# Patient Record
Sex: Female | Born: 1945
Health system: Southern US, Community
[De-identification: ages and names within clinical notes are randomized; demographics above are authoritative.]

## PROBLEM LIST (undated history)

## (undated) DIAGNOSIS — C801 Malignant (primary) neoplasm, unspecified: Secondary | ICD-10-CM

## (undated) DIAGNOSIS — K859 Acute pancreatitis without necrosis or infection, unspecified: Secondary | ICD-10-CM

## (undated) DIAGNOSIS — F329 Major depressive disorder, single episode, unspecified: Secondary | ICD-10-CM

## (undated) DIAGNOSIS — Z853 Personal history of malignant neoplasm of breast: Secondary | ICD-10-CM

## (undated) DIAGNOSIS — E785 Hyperlipidemia, unspecified: Secondary | ICD-10-CM

## (undated) DIAGNOSIS — F419 Anxiety disorder, unspecified: Secondary | ICD-10-CM

## (undated) DIAGNOSIS — F32A Depression, unspecified: Secondary | ICD-10-CM

## (undated) DIAGNOSIS — D649 Anemia, unspecified: Secondary | ICD-10-CM

## (undated) DIAGNOSIS — M1990 Unspecified osteoarthritis, unspecified site: Secondary | ICD-10-CM

## (undated) DIAGNOSIS — I10 Essential (primary) hypertension: Secondary | ICD-10-CM

## (undated) DIAGNOSIS — K56609 Unspecified intestinal obstruction, unspecified as to partial versus complete obstruction: Secondary | ICD-10-CM

## (undated) HISTORY — DX: Unspecified intestinal obstruction, unspecified as to partial versus complete obstruction: K56.609

## (undated) HISTORY — DX: Personal history of malignant neoplasm of breast: Z85.3

## (undated) HISTORY — DX: Acute pancreatitis without necrosis or infection, unspecified: K85.90

## (undated) HISTORY — DX: Hyperlipidemia, unspecified: E78.5

## (undated) HISTORY — PX: ABDOMINAL HYSTERECTOMY: SHX81

## (undated) HISTORY — DX: Anxiety disorder, unspecified: F41.9

---

## 1998-12-20 ENCOUNTER — Ambulatory Visit (HOSPITAL_COMMUNITY): Admission: RE | Admit: 1998-12-20 | Discharge: 1998-12-20 | Payer: Self-pay | Admitting: Family Medicine

## 1998-12-20 ENCOUNTER — Encounter (INDEPENDENT_AMBULATORY_CARE_PROVIDER_SITE_OTHER): Payer: Self-pay

## 1998-12-20 ENCOUNTER — Encounter: Payer: Self-pay | Admitting: Family Medicine

## 2000-02-25 ENCOUNTER — Ambulatory Visit (HOSPITAL_COMMUNITY): Admission: RE | Admit: 2000-02-25 | Discharge: 2000-02-25 | Payer: Self-pay | Admitting: Family Medicine

## 2000-02-25 ENCOUNTER — Encounter: Payer: Self-pay | Admitting: Family Medicine

## 2000-03-17 ENCOUNTER — Encounter: Admission: RE | Admit: 2000-03-17 | Discharge: 2000-03-17 | Payer: Self-pay | Admitting: Family Medicine

## 2001-07-27 ENCOUNTER — Encounter: Payer: Self-pay | Admitting: Family Medicine

## 2001-07-27 ENCOUNTER — Ambulatory Visit (HOSPITAL_COMMUNITY): Admission: RE | Admit: 2001-07-27 | Discharge: 2001-07-27 | Payer: Self-pay | Admitting: Family Medicine

## 2001-08-12 ENCOUNTER — Encounter: Admission: RE | Admit: 2001-08-12 | Discharge: 2001-08-12 | Payer: Self-pay | Admitting: Family Medicine

## 2001-08-12 ENCOUNTER — Encounter (INDEPENDENT_AMBULATORY_CARE_PROVIDER_SITE_OTHER): Payer: Self-pay | Admitting: *Deleted

## 2001-08-12 ENCOUNTER — Encounter: Payer: Self-pay | Admitting: Family Medicine

## 2001-08-12 DIAGNOSIS — Z853 Personal history of malignant neoplasm of breast: Secondary | ICD-10-CM

## 2001-08-17 DIAGNOSIS — Z853 Personal history of malignant neoplasm of breast: Secondary | ICD-10-CM

## 2001-08-17 HISTORY — DX: Personal history of malignant neoplasm of breast: Z85.3

## 2001-08-19 ENCOUNTER — Encounter: Admission: RE | Admit: 2001-08-19 | Discharge: 2001-08-19 | Payer: Self-pay | Admitting: Surgery

## 2001-08-19 ENCOUNTER — Encounter: Payer: Self-pay | Admitting: Surgery

## 2001-08-20 ENCOUNTER — Encounter: Admission: RE | Admit: 2001-08-20 | Discharge: 2001-08-20 | Payer: Self-pay | Admitting: Surgery

## 2001-08-20 ENCOUNTER — Ambulatory Visit (HOSPITAL_BASED_OUTPATIENT_CLINIC_OR_DEPARTMENT_OTHER): Admission: RE | Admit: 2001-08-20 | Discharge: 2001-08-20 | Payer: Self-pay | Admitting: Surgery

## 2001-08-20 ENCOUNTER — Encounter: Payer: Self-pay | Admitting: Surgery

## 2001-08-20 ENCOUNTER — Encounter (INDEPENDENT_AMBULATORY_CARE_PROVIDER_SITE_OTHER): Payer: Self-pay | Admitting: *Deleted

## 2001-08-20 HISTORY — PX: BREAST LUMPECTOMY W/ NEEDLE LOCALIZATION: SHX1266

## 2001-08-31 ENCOUNTER — Ambulatory Visit: Admission: RE | Admit: 2001-08-31 | Discharge: 2001-11-29 | Payer: Self-pay | Admitting: Radiation Oncology

## 2001-12-08 ENCOUNTER — Ambulatory Visit (HOSPITAL_COMMUNITY): Admission: RE | Admit: 2001-12-08 | Discharge: 2001-12-08 | Payer: Self-pay | Admitting: Family Medicine

## 2001-12-08 ENCOUNTER — Encounter: Payer: Self-pay | Admitting: Family Medicine

## 2002-01-19 ENCOUNTER — Encounter: Admission: RE | Admit: 2002-01-19 | Discharge: 2002-01-19 | Payer: Self-pay | Admitting: Surgery

## 2002-01-19 ENCOUNTER — Encounter: Payer: Self-pay | Admitting: Surgery

## 2002-03-09 ENCOUNTER — Encounter: Payer: Self-pay | Admitting: Surgery

## 2002-03-09 ENCOUNTER — Encounter: Admission: RE | Admit: 2002-03-09 | Discharge: 2002-03-09 | Payer: Self-pay | Admitting: Surgery

## 2002-06-22 ENCOUNTER — Encounter: Admission: RE | Admit: 2002-06-22 | Discharge: 2002-06-22 | Payer: Self-pay | Admitting: Surgery

## 2002-06-22 ENCOUNTER — Encounter: Payer: Self-pay | Admitting: Surgery

## 2003-07-14 ENCOUNTER — Encounter: Admission: RE | Admit: 2003-07-14 | Discharge: 2003-07-14 | Payer: Self-pay | Admitting: Surgery

## 2004-11-20 ENCOUNTER — Encounter: Admission: RE | Admit: 2004-11-20 | Discharge: 2004-11-20 | Payer: Self-pay | Admitting: Family Medicine

## 2005-01-23 ENCOUNTER — Encounter: Admission: RE | Admit: 2005-01-23 | Discharge: 2005-01-23 | Payer: Self-pay | Admitting: Family Medicine

## 2006-01-01 ENCOUNTER — Encounter: Admission: RE | Admit: 2006-01-01 | Discharge: 2006-01-01 | Payer: Self-pay | Admitting: Family Medicine

## 2007-02-05 ENCOUNTER — Encounter: Admission: RE | Admit: 2007-02-05 | Discharge: 2007-02-05 | Payer: Self-pay | Admitting: Family Medicine

## 2008-02-10 ENCOUNTER — Ambulatory Visit (HOSPITAL_COMMUNITY): Admission: RE | Admit: 2008-02-10 | Discharge: 2008-02-10 | Payer: Self-pay | Admitting: Family Medicine

## 2009-01-06 ENCOUNTER — Observation Stay (HOSPITAL_COMMUNITY): Admission: EM | Admit: 2009-01-06 | Discharge: 2009-01-07 | Payer: Self-pay | Admitting: Emergency Medicine

## 2009-02-02 ENCOUNTER — Encounter: Admission: RE | Admit: 2009-02-02 | Discharge: 2009-02-02 | Payer: Self-pay | Admitting: Gastroenterology

## 2009-02-15 ENCOUNTER — Ambulatory Visit (HOSPITAL_COMMUNITY): Admission: RE | Admit: 2009-02-15 | Discharge: 2009-02-15 | Payer: Self-pay | Admitting: Family Medicine

## 2009-08-11 ENCOUNTER — Inpatient Hospital Stay (HOSPITAL_COMMUNITY): Admission: EM | Admit: 2009-08-11 | Discharge: 2009-08-19 | Payer: Self-pay | Admitting: Emergency Medicine

## 2009-08-13 ENCOUNTER — Encounter (INDEPENDENT_AMBULATORY_CARE_PROVIDER_SITE_OTHER): Payer: Self-pay

## 2009-08-13 HISTORY — PX: EXPLORATORY LAPAROTOMY W/ BOWEL RESECTION: SHX1544

## 2010-07-10 LAB — CBC
HCT: 35.9 % — ABNORMAL LOW (ref 36.0–46.0)
HCT: 41.2 % (ref 36.0–46.0)
Hemoglobin: 10.8 g/dL — ABNORMAL LOW (ref 12.0–15.0)
Hemoglobin: 11.6 g/dL — ABNORMAL LOW (ref 12.0–15.0)
Hemoglobin: 14 g/dL (ref 12.0–15.0)
MCHC: 33.3 g/dL (ref 30.0–36.0)
MCHC: 33.9 g/dL (ref 30.0–36.0)
MCHC: 34 g/dL (ref 30.0–36.0)
MCHC: 34 g/dL (ref 30.0–36.0)
MCV: 96.5 fL (ref 78.0–100.0)
MCV: 97.1 fL (ref 78.0–100.0)
MCV: 97.2 fL (ref 78.0–100.0)
Platelets: 197 10*3/uL (ref 150–400)
Platelets: 276 10*3/uL (ref 150–400)
RBC: 3.34 MIL/uL — ABNORMAL LOW (ref 3.87–5.11)
RBC: 3.52 MIL/uL — ABNORMAL LOW (ref 3.87–5.11)
RBC: 3.69 MIL/uL — ABNORMAL LOW (ref 3.87–5.11)
RBC: 4.27 MIL/uL (ref 3.87–5.11)
RDW: 15 % (ref 11.5–15.5)
RDW: 15.3 % (ref 11.5–15.5)
WBC: 13.8 10*3/uL — ABNORMAL HIGH (ref 4.0–10.5)
WBC: 6.9 10*3/uL (ref 4.0–10.5)
WBC: 7 10*3/uL (ref 4.0–10.5)

## 2010-07-10 LAB — GLUCOSE, CAPILLARY
Glucose-Capillary: 102 mg/dL — ABNORMAL HIGH (ref 70–99)
Glucose-Capillary: 105 mg/dL — ABNORMAL HIGH (ref 70–99)
Glucose-Capillary: 107 mg/dL — ABNORMAL HIGH (ref 70–99)
Glucose-Capillary: 108 mg/dL — ABNORMAL HIGH (ref 70–99)
Glucose-Capillary: 109 mg/dL — ABNORMAL HIGH (ref 70–99)
Glucose-Capillary: 110 mg/dL — ABNORMAL HIGH (ref 70–99)
Glucose-Capillary: 111 mg/dL — ABNORMAL HIGH (ref 70–99)
Glucose-Capillary: 117 mg/dL — ABNORMAL HIGH (ref 70–99)
Glucose-Capillary: 126 mg/dL — ABNORMAL HIGH (ref 70–99)
Glucose-Capillary: 130 mg/dL — ABNORMAL HIGH (ref 70–99)
Glucose-Capillary: 131 mg/dL — ABNORMAL HIGH (ref 70–99)
Glucose-Capillary: 134 mg/dL — ABNORMAL HIGH (ref 70–99)
Glucose-Capillary: 134 mg/dL — ABNORMAL HIGH (ref 70–99)
Glucose-Capillary: 146 mg/dL — ABNORMAL HIGH (ref 70–99)
Glucose-Capillary: 152 mg/dL — ABNORMAL HIGH (ref 70–99)
Glucose-Capillary: 56 mg/dL — ABNORMAL LOW (ref 70–99)
Glucose-Capillary: 74 mg/dL (ref 70–99)
Glucose-Capillary: 84 mg/dL (ref 70–99)
Glucose-Capillary: 86 mg/dL (ref 70–99)
Glucose-Capillary: 93 mg/dL (ref 70–99)
Glucose-Capillary: 94 mg/dL (ref 70–99)
Glucose-Capillary: 95 mg/dL (ref 70–99)
Glucose-Capillary: 95 mg/dL (ref 70–99)
Glucose-Capillary: 99 mg/dL (ref 70–99)

## 2010-07-10 LAB — COMPREHENSIVE METABOLIC PANEL
ALT: 13 U/L (ref 0–35)
AST: 33 U/L (ref 0–37)
Albumin: 5.4 g/dL — ABNORMAL HIGH (ref 3.5–5.2)
Alkaline Phosphatase: 57 U/L (ref 39–117)
BUN: 15 mg/dL (ref 6–23)
CO2: 28 mEq/L (ref 19–32)
Calcium: 11.1 mg/dL — ABNORMAL HIGH (ref 8.4–10.5)
Chloride: 99 mEq/L (ref 96–112)
Creatinine, Ser: 0.96 mg/dL (ref 0.4–1.2)
GFR calc Af Amer: >60 mL/min (ref >60)
GFR calc non Af Amer: 59 mL/min — ABNORMAL LOW (ref >60)
Glucose, Bld: 193 mg/dL — ABNORMAL HIGH (ref 70–99)
Potassium: 3.9 mEq/L (ref 3.5–5.1)
Sodium: 140 mEq/L (ref 135–145)
Total Bilirubin: 0.6 mg/dL (ref 0.3–1.2)
Total Protein: 8.7 g/dL — ABNORMAL HIGH (ref 6.0–8.3)

## 2010-07-10 LAB — BASIC METABOLIC PANEL
BUN: 11 mg/dL (ref 6–23)
CO2: 23 mEq/L (ref 19–32)
CO2: 26 mEq/L (ref 19–32)
CO2: 27 mEq/L (ref 19–32)
Calcium: 8 mg/dL — ABNORMAL LOW (ref 8.4–10.5)
Calcium: 8.2 mg/dL — ABNORMAL LOW (ref 8.4–10.5)
Chloride: 104 mEq/L (ref 96–112)
Chloride: 107 mEq/L (ref 96–112)
Creatinine, Ser: 0.6 mg/dL (ref 0.4–1.2)
Creatinine, Ser: 0.67 mg/dL (ref 0.4–1.2)
GFR calc Af Amer: >60 mL/min (ref >60)
GFR calc Af Amer: >60 mL/min (ref >60)
GFR calc non Af Amer: >60 mL/min (ref >60)
Glucose, Bld: 127 mg/dL — ABNORMAL HIGH (ref 70–99)
Potassium: 4.3 mEq/L (ref 3.5–5.1)
Sodium: 139 mEq/L (ref 135–145)
Sodium: 140 mEq/L (ref 135–145)

## 2010-07-10 LAB — DIFFERENTIAL
Basophils Absolute: 0 10*3/uL (ref 0.0–0.1)
Basophils Relative: 0 % (ref 0–1)
Eosinophils Absolute: 0.1 10*3/uL (ref 0.0–0.7)
Eosinophils Relative: 1 % (ref 0–5)
Lymphocytes Relative: 17 % (ref 12–46)
Lymphs Abs: 2.3 10*3/uL (ref 0.7–4.0)
Monocytes Absolute: 0.2 10*3/uL (ref 0.1–1.0)
Monocytes Relative: 2 % — ABNORMAL LOW (ref 3–12)
Neutro Abs: 11.2 10*3/uL — ABNORMAL HIGH (ref 1.7–7.7)
Neutrophils Relative %: 81 % — ABNORMAL HIGH (ref 43–77)

## 2010-07-10 LAB — URINE MICROSCOPIC-ADD ON

## 2010-07-10 LAB — LIPASE, BLOOD: Lipase: 12 U/L (ref 11–59)

## 2010-07-10 LAB — URINALYSIS, ROUTINE W REFLEX MICROSCOPIC
Nitrite: NEGATIVE
Protein, ur: 30 mg/dL — AB
Specific Gravity, Urine: 1.027 (ref 1.005–1.030)
Urobilinogen, UA: 0.2 mg/dL (ref 0.0–1.0)

## 2010-07-10 LAB — HEMOGLOBIN A1C: Mean Plasma Glucose: 157 mg/dL — ABNORMAL HIGH (ref <117)

## 2010-07-27 LAB — CULTURE, BLOOD (ROUTINE X 2)

## 2010-07-27 LAB — LIPID PANEL
Cholesterol: 120 mg/dL (ref 0–200)
HDL: 44 mg/dL (ref >39)
LDL Cholesterol: 59 mg/dL (ref 0–99)
Total CHOL/HDL Ratio: 2.7 RATIO
VLDL: 17 mg/dL (ref 0–40)

## 2010-07-27 LAB — RETICULOCYTES
RBC.: 3.33 MIL/uL — ABNORMAL LOW (ref 3.87–5.11)
Retic Ct Pct: 0.5 % (ref 0.4–3.1)

## 2010-07-27 LAB — COMPREHENSIVE METABOLIC PANEL
ALT: 11 U/L (ref 0–35)
ALT: 21 U/L (ref 0–35)
AST: 40 U/L — ABNORMAL HIGH (ref 0–37)
Alkaline Phosphatase: 47 U/L (ref 39–117)
BUN: 5 mg/dL — ABNORMAL LOW (ref 6–23)
CO2: 26 mEq/L (ref 19–32)
CO2: 28 mEq/L (ref 19–32)
Calcium: 9.6 mg/dL (ref 8.4–10.5)
Chloride: 108 mEq/L (ref 96–112)
GFR calc Af Amer: >60 mL/min (ref >60)
GFR calc non Af Amer: >60 mL/min (ref >60)
GFR calc non Af Amer: >60 mL/min (ref >60)
Glucose, Bld: 73 mg/dL (ref 70–99)
Potassium: 3.8 mEq/L (ref 3.5–5.1)
Sodium: 138 mEq/L (ref 135–145)
Sodium: 140 mEq/L (ref 135–145)
Total Bilirubin: 0.6 mg/dL (ref 0.3–1.2)
Total Protein: 5.5 g/dL — ABNORMAL LOW (ref 6.0–8.3)
Total Protein: 7.1 g/dL (ref 6.0–8.3)

## 2010-07-27 LAB — CBC
MCHC: 32.9 g/dL (ref 30.0–36.0)
Platelets: 206 10*3/uL (ref 150–400)
RBC: 3.86 MIL/uL — ABNORMAL LOW (ref 3.87–5.11)
WBC: 6.9 10*3/uL (ref 4.0–10.5)

## 2010-07-27 LAB — DIFFERENTIAL
Basophils Absolute: 0.1 10*3/uL (ref 0.0–0.1)
Basophils Relative: 1 % (ref 0–1)
Eosinophils Absolute: 0.1 10*3/uL (ref 0.0–0.7)
Eosinophils Absolute: 0.2 10*3/uL (ref 0.0–0.7)
Eosinophils Relative: 2 % (ref 0–5)
Lymphs Abs: 3.3 10*3/uL (ref 0.7–4.0)
Monocytes Relative: 4 % (ref 3–12)
Monocytes Relative: 5 % (ref 3–12)
Neutro Abs: 3.8 10*3/uL (ref 1.7–7.7)
Neutrophils Relative %: 56 % (ref 43–77)

## 2010-07-27 LAB — CARDIAC PANEL(CRET KIN+CKTOT+MB+TROPI)
CK, MB: 1.7 ng/mL (ref 0.3–4.0)
CK, MB: 1.7 ng/mL (ref 0.3–4.0)
Relative Index: 1.8 (ref 0.0–2.5)
Relative Index: INVALID (ref 0.0–2.5)
Relative Index: INVALID (ref 0.0–2.5)
Total CK: 99 U/L (ref 7–177)
Total CK: 99 U/L (ref 7–177)
Troponin I: 0.01 ng/mL (ref 0.00–0.06)

## 2010-07-27 LAB — VITAMIN B12: Vitamin B-12: 1256 pg/mL — ABNORMAL HIGH (ref 211–911)

## 2010-07-27 LAB — GLUCOSE, CAPILLARY: Glucose-Capillary: 150 mg/dL — ABNORMAL HIGH (ref 70–99)

## 2010-07-27 LAB — CEA: CEA: 2.7 ng/mL (ref 0.0–5.0)

## 2010-07-27 LAB — PHOSPHORUS: Phosphorus: 3.5 mg/dL (ref 2.3–4.6)

## 2010-07-27 LAB — IRON AND TIBC: Iron: 35 ug/dL — ABNORMAL LOW (ref 42–135)

## 2010-09-07 NOTE — Op Note (Signed)
[place]. [facility]  Patient:    Sara Weeks, Sara Weeks Visit Number: [id number] MRN: [id number]          Service Type: DSU Location: [facility] Attending Physician:  Charlton Haws Dictated by:   Currie Paris, M.D. Proc. Date: 08/20/01 Admit Date:  08/20/2001   CC:         Duncan Dull, M.D.  [facility]   Operative Report  ACCOUNT NO. [id number]. CCS [phone].  PREOPERATIVE DIAGNOSIS:  Ductal carcinoma in situ, left breast lower outer quadrant.  POSTOPERATIVE DIAGNOSIS:  Ductal carcinoma in situ, left breast lower outer quadrant.  PROCEDURE:  Needle-guided excision of left breast cancer.  SURGEON:  Currie Paris, M.D.  ANESTHESIA:  General.  CLINICAL HISTORY:  This patient is a 65 year old who recently had a mammogram done which showed some new clustered calcifications, and large-core biopsy showed DCIS.  They appeared to be about a centimeter or so in area.  DESCRIPTION OF PROCEDURE:  The patient was seen in the holding area and had no further questions.  A guidewire had been previously placed.  She was taken to the operating room and after satisfactory general anesthesia had been obtained, the breast was prepped and draped.  The guidewire entered in the lower outer quadrant of the left breast and tracked medially and superiorly. I outlined an elliptical incision starting just on the outside of the breast side of the guidewire and heading toward the nipple so that it was in a radial fashion.  Once the incision was made, I deepened it along the inferior edge so that I could get down to the chest wall.  The guidewire was tracking away from here, and I thought that would give [place] adequate inferior margin, and I went down to the chest wall.  Since the guidewire was tracking medially, I then divided the inferior tissues again down to the chest wall.  With me able then to grasp the entire specimen, pull up on it, I then  completed the excision using the cautery working medially around the nipple edge and then superiorly to get well above and around this area and again all the way down to the chest wall.  The guidewire appeared to be completely contained within the specimen, and this was sent for specimen mammography.  The wound was checked for hemostasis, and this was achieved by cautery.  I put some metal clips in to help delineate the margins of excision and then closed the breast in layers with 3-0 Vicryl, followed by 4-0 Monocryl subcuticular on the skin and Steri-Strips.  The patient tolerated the procedure well.  There were no operative complications.  All counts were correct.  Radiology indicated that the mammography contained the calcifications. Dictated by:   Currie Paris, M.D. Attending Physician:  Charlton Haws DD:  08/20/01 TD:  08/22/01 Job: [phone]

## 2010-11-13 ENCOUNTER — Other Ambulatory Visit (HOSPITAL_COMMUNITY): Payer: Self-pay | Admitting: Family Medicine

## 2010-11-13 DIAGNOSIS — Z1231 Encounter for screening mammogram for malignant neoplasm of breast: Secondary | ICD-10-CM

## 2010-11-28 ENCOUNTER — Ambulatory Visit (HOSPITAL_COMMUNITY)
Admission: RE | Admit: 2010-11-28 | Discharge: 2010-11-28 | Disposition: A | Discharge status: Home / Self Care | Payer: 59 | Source: Ambulatory Visit | Attending: Family Medicine | Admitting: Family Medicine

## 2010-11-28 DIAGNOSIS — Z1231 Encounter for screening mammogram for malignant neoplasm of breast: Secondary | ICD-10-CM | POA: Insufficient documentation

## 2011-02-12 ENCOUNTER — Other Ambulatory Visit: Payer: Self-pay | Admitting: Family Medicine

## 2011-02-12 ENCOUNTER — Ambulatory Visit
Admission: RE | Admit: 2011-02-12 | Discharge: 2011-02-12 | Disposition: A | Discharge status: Home / Self Care | Payer: 59 | Source: Ambulatory Visit | Attending: Family Medicine | Admitting: Family Medicine

## 2011-03-18 ENCOUNTER — Encounter (INDEPENDENT_AMBULATORY_CARE_PROVIDER_SITE_OTHER): Payer: Self-pay | Admitting: Surgery

## 2011-03-20 ENCOUNTER — Encounter (INDEPENDENT_AMBULATORY_CARE_PROVIDER_SITE_OTHER): Payer: Self-pay | Admitting: Surgery

## 2011-03-22 ENCOUNTER — Ambulatory Visit (INDEPENDENT_AMBULATORY_CARE_PROVIDER_SITE_OTHER): Payer: Medicare Other | Admitting: Surgery

## 2011-03-22 ENCOUNTER — Encounter (INDEPENDENT_AMBULATORY_CARE_PROVIDER_SITE_OTHER): Payer: Self-pay | Admitting: Surgery

## 2011-03-22 VITALS — BP 110/72 | HR 66 | Temp 97.6°F | Resp 16 | Ht 60.0 in | Wt 95.0 lb

## 2011-03-22 DIAGNOSIS — R143 Flatulence: Secondary | ICD-10-CM

## 2011-03-22 DIAGNOSIS — Z853 Personal history of malignant neoplasm of breast: Secondary | ICD-10-CM

## 2011-03-22 DIAGNOSIS — R1084 Generalized abdominal pain: Secondary | ICD-10-CM

## 2011-03-22 DIAGNOSIS — R140 Abdominal distension (gaseous): Secondary | ICD-10-CM

## 2011-03-22 DIAGNOSIS — R141 Gas pain: Secondary | ICD-10-CM

## 2011-03-22 NOTE — Patient Instructions (Signed)
We will arrange a CT scan to see if it provides more information about the cause of your abdominal symptoms

## 2011-03-25 NOTE — Progress Notes (Signed)
Patient ID: Sara Weeks, female   DOB: 1945/09/29, 65 y.o.   MRN: [id number]  Chief Complaint  Patient presents with  . Other    Eval abdomen    HPI Sara Weeks is a 65 y.o. female.  She presents for evaluation of recurring abdominal pain. About 2 years ago I did an urgent exploratory laparotomy lysis of adhesions and small bowel resection or a small bowel obstruction. She did well from that but had issues with abdominal pain for a few months afterwards. However she then became completely asymptomatic until more recently when she has had episodes of bloating and some left lower and mid abdominal pain. Occasionally associated with some mild nausea. She does not get significantly distended nor does she vomit. She continues to have normal bowel movements.she had a KUB for evaluation which showed some distended loops of small bowel but no complete obstruction. She was sent here for evaluation to see if any further tests were surgery might be indicated. Of note is that I also treated her in the past for DCIS. HPI  Past Medical History  Diagnosis Date  . Diabetes mellitus   . Hx breast cancer, DCIS, L 08/17/2001    Past Surgical History  Procedure Date  . Exploratory laparotomy w/ bowel resection 08/13/09  . Breast lumpectomy w/ needle localization 08/20/2001    left    Family History  Problem Relation Age of Onset  . Heart attack Mother   . Prostate cancer Father     Social History History  Substance Use Topics  . Smoking status: Current Everyday Smoker -- 1.0 packs/day  . Smokeless tobacco: Never Used  . Alcohol Use: No    Allergies  Allergen Reactions  . Penicillins Rash    Arms only.    Current Outpatient Prescriptions  Medication Sig Dispense Refill  . escitalopram (LEXAPRO) 20 MG tablet 20 mg daily.       . hyoscyamine (ANASPAZ) 0.125 MG TBDP Place 0.125 mg under the tongue every 6 (six) hours as needed.        . hyoscyamine (LEVSIN SL) 0.125 MG SL tablet Place 0.125  mg under the tongue every 4 (four) hours as needed.        Marland Kitchen amitriptyline (ELAVIL) 75 MG tablet Take by mouth at bedtime.        Marland Kitchen aspirin 81 MG tablet Take 81 mg by mouth daily.        Marland Kitchen lisinopril-hydrochlorothiazide (PRINZIDE,ZESTORETIC) 20-25 MG per tablet 1 tablet daily.       . metFORMIN (GLUCOPHAGE) 500 MG tablet Take 500 mg by mouth 2 (two) times daily with a meal.          Review of Systems Review of Systems  Constitutional: Negative for fever, chills and unexpected weight change.  HENT: Negative for hearing loss, congestion, sore throat, trouble swallowing and voice change.   Eyes: Negative for visual disturbance.  Respiratory: Negative for cough and wheezing.   Cardiovascular: Negative for chest pain, palpitations and leg swelling.  Gastrointestinal: Negative for nausea, vomiting, abdominal pain, diarrhea, constipation, blood in stool, abdominal distention and anal bleeding.  Genitourinary: Negative for hematuria, vaginal bleeding and difficulty urinating.  Musculoskeletal: Negative for arthralgias.  Skin: Negative for rash and wound.  Neurological: Negative for seizures, syncope and headaches.  Hematological: Negative for adenopathy. Does not bruise/bleed easily.  Psychiatric/Behavioral: Negative for confusion.    Blood pressure 110/72, pulse 66, temperature 97.6 F (36.4 C), temperature source Temporal, resp. rate 16, height  5' (1.524 m), weight 95 lb (43.092 kg).  Physical Exam Physical Exam  Constitutional: She is oriented to person, place, and time. She appears well-developed and well-nourished. No distress.  HENT:  Head: Atraumatic.  Mouth/Throat: No oropharyngeal exudate.  Eyes: EOM are normal. Pupils are equal, round, and reactive to light. No scleral icterus.  Neck: No tracheal deviation present. No thyromegaly present.  Cardiovascular: Normal rate, regular rhythm and intact distal pulses.  Exam reveals no gallop and no friction rub.   No murmur  heard. Pulmonary/Chest: Effort normal and breath sounds normal. No respiratory distress.  Abdominal: Soft. Bowel sounds are normal. She exhibits distension. She exhibits no mass. There is no tenderness. There is no rebound and no guarding.       She has a well-healed midline scar. Her distention is very minimal. She is nontender. There are no hernias present. Other than the mild distention her abdomen is unremarkable.  Musculoskeletal: Normal range of motion. She exhibits no edema and no tenderness.  Neurological: She is alert and oriented to person, place, and time.  Skin: Skin is warm and dry. She is not diaphoretic. No erythema.  Psychiatric: She has a normal mood and affect. Her behavior is normal. Judgment and thought content normal.    Data Reviewed I have reviewed her KUB report and films. I have also reviewed our old records and discharge summaries operative notes etc.  Assessment    Abdominal pain intermittent with mild intermittent distention and an abnormality on KUB suggesting perhaps a very partial small bowel obstruction.    Plan    I would like to get a CT enteroclysis small bowel study to see if there is any evidence of partial obstruction. I don't think her symptoms warrant exploration at the present time.  I discussed this with the patient and her husband. I think they understand our plans. She is reluctant to have surgery unless absolutely necessary.       Sara Weeks J 03/25/2011, 1:51 PM

## 2011-03-28 ENCOUNTER — Ambulatory Visit
Admission: RE | Admit: 2011-03-28 | Discharge: 2011-03-28 | Disposition: A | Discharge status: Home / Self Care | Payer: 59 | Source: Ambulatory Visit | Attending: Surgery | Admitting: Surgery

## 2011-03-28 DIAGNOSIS — R1084 Generalized abdominal pain: Secondary | ICD-10-CM

## 2011-03-28 MED ORDER — IOHEXOL 300 MG/ML  SOLN
100.0000 mL | Freq: Once | INTRAMUSCULAR | Status: AC | PRN
  Administered 2011-03-28: 100 mL via INTRAVENOUS

## 2011-03-28 NOTE — Progress Notes (Signed)
Quick Note:  Please tell her that the scan is completely normal with no evidence of a blockage, or any other problem. It is not clear why she had had episodes as she has, but there is no surgical problem apparent on the CT ______

## 2011-03-29 ENCOUNTER — Telehealth (INDEPENDENT_AMBULATORY_CARE_PROVIDER_SITE_OTHER): Payer: Self-pay | Admitting: General Surgery

## 2011-03-29 NOTE — Telephone Encounter (Signed)
Message copied by Liliana Cline on Fri Mar 29, 2011 10:00 AM ------      Message from: Currie Paris      Created: Thu Mar 28, 2011  2:20 PM       Please tell her that the scan is completely normal with no evidence of a blockage, or any other problem. It is not clear why she had had episodes as she has, but there is no surgical problem apparent on the CT

## 2011-03-29 NOTE — Telephone Encounter (Signed)
Patient made aware no surgical problems on CT and no shown reason as to why she was having those symptoms. Patient expresses understand and will follow up with her PCP.

## 2011-05-27 DIAGNOSIS — I10 Essential (primary) hypertension: Secondary | ICD-10-CM | POA: Diagnosis not present

## 2011-05-27 DIAGNOSIS — E7800 Pure hypercholesterolemia, unspecified: Secondary | ICD-10-CM | POA: Diagnosis not present

## 2011-05-27 DIAGNOSIS — F329 Major depressive disorder, single episode, unspecified: Secondary | ICD-10-CM | POA: Diagnosis not present

## 2011-05-27 DIAGNOSIS — E119 Type 2 diabetes mellitus without complications: Secondary | ICD-10-CM | POA: Diagnosis not present

## 2011-08-28 DIAGNOSIS — E7800 Pure hypercholesterolemia, unspecified: Secondary | ICD-10-CM | POA: Diagnosis not present

## 2011-08-28 DIAGNOSIS — I10 Essential (primary) hypertension: Secondary | ICD-10-CM | POA: Diagnosis not present

## 2011-08-28 DIAGNOSIS — F329 Major depressive disorder, single episode, unspecified: Secondary | ICD-10-CM | POA: Diagnosis not present

## 2011-10-15 DIAGNOSIS — E119 Type 2 diabetes mellitus without complications: Secondary | ICD-10-CM | POA: Diagnosis not present

## 2011-10-15 DIAGNOSIS — H2510 Age-related nuclear cataract, unspecified eye: Secondary | ICD-10-CM | POA: Diagnosis not present

## 2011-12-02 DIAGNOSIS — Z23 Encounter for immunization: Secondary | ICD-10-CM | POA: Diagnosis not present

## 2011-12-02 DIAGNOSIS — E119 Type 2 diabetes mellitus without complications: Secondary | ICD-10-CM | POA: Diagnosis not present

## 2011-12-02 DIAGNOSIS — I10 Essential (primary) hypertension: Secondary | ICD-10-CM | POA: Diagnosis not present

## 2011-12-02 DIAGNOSIS — E7800 Pure hypercholesterolemia, unspecified: Secondary | ICD-10-CM | POA: Diagnosis not present

## 2011-12-02 DIAGNOSIS — F329 Major depressive disorder, single episode, unspecified: Secondary | ICD-10-CM | POA: Diagnosis not present

## 2012-01-10 DIAGNOSIS — J069 Acute upper respiratory infection, unspecified: Secondary | ICD-10-CM | POA: Diagnosis not present

## 2012-01-29 DIAGNOSIS — R05 Cough: Secondary | ICD-10-CM | POA: Diagnosis not present

## 2012-03-05 DIAGNOSIS — E559 Vitamin D deficiency, unspecified: Secondary | ICD-10-CM | POA: Diagnosis not present

## 2012-03-05 DIAGNOSIS — I10 Essential (primary) hypertension: Secondary | ICD-10-CM | POA: Diagnosis not present

## 2012-03-05 DIAGNOSIS — Z23 Encounter for immunization: Secondary | ICD-10-CM | POA: Diagnosis not present

## 2012-03-05 DIAGNOSIS — F329 Major depressive disorder, single episode, unspecified: Secondary | ICD-10-CM | POA: Diagnosis not present

## 2012-03-05 DIAGNOSIS — E7800 Pure hypercholesterolemia, unspecified: Secondary | ICD-10-CM | POA: Diagnosis not present

## 2012-03-05 DIAGNOSIS — Z13820 Encounter for screening for osteoporosis: Secondary | ICD-10-CM | POA: Diagnosis not present

## 2012-05-07 DIAGNOSIS — M899 Disorder of bone, unspecified: Secondary | ICD-10-CM | POA: Diagnosis not present

## 2012-05-07 DIAGNOSIS — M949 Disorder of cartilage, unspecified: Secondary | ICD-10-CM | POA: Diagnosis not present

## 2012-05-07 DIAGNOSIS — Z13820 Encounter for screening for osteoporosis: Secondary | ICD-10-CM | POA: Diagnosis not present

## 2012-09-23 DIAGNOSIS — I10 Essential (primary) hypertension: Secondary | ICD-10-CM | POA: Diagnosis not present

## 2012-09-23 DIAGNOSIS — Z0000 Encounter for general adult medical examination without abnormal findings: Secondary | ICD-10-CM | POA: Diagnosis not present

## 2012-09-23 DIAGNOSIS — F411 Generalized anxiety disorder: Secondary | ICD-10-CM | POA: Diagnosis not present

## 2012-09-23 DIAGNOSIS — E7800 Pure hypercholesterolemia, unspecified: Secondary | ICD-10-CM | POA: Diagnosis not present

## 2012-09-23 DIAGNOSIS — K589 Irritable bowel syndrome without diarrhea: Secondary | ICD-10-CM | POA: Diagnosis not present

## 2012-09-23 DIAGNOSIS — F329 Major depressive disorder, single episode, unspecified: Secondary | ICD-10-CM | POA: Diagnosis not present

## 2012-09-23 DIAGNOSIS — E119 Type 2 diabetes mellitus without complications: Secondary | ICD-10-CM | POA: Diagnosis not present

## 2012-10-13 DIAGNOSIS — H2510 Age-related nuclear cataract, unspecified eye: Secondary | ICD-10-CM | POA: Diagnosis not present

## 2012-10-13 DIAGNOSIS — E119 Type 2 diabetes mellitus without complications: Secondary | ICD-10-CM | POA: Diagnosis not present

## 2012-12-31 DIAGNOSIS — R5381 Other malaise: Secondary | ICD-10-CM | POA: Diagnosis not present

## 2012-12-31 DIAGNOSIS — D649 Anemia, unspecified: Secondary | ICD-10-CM | POA: Diagnosis not present

## 2012-12-31 DIAGNOSIS — Z79899 Other long term (current) drug therapy: Secondary | ICD-10-CM | POA: Diagnosis not present

## 2012-12-31 DIAGNOSIS — I10 Essential (primary) hypertension: Secondary | ICD-10-CM | POA: Diagnosis not present

## 2012-12-31 DIAGNOSIS — F17200 Nicotine dependence, unspecified, uncomplicated: Secondary | ICD-10-CM | POA: Diagnosis not present

## 2012-12-31 DIAGNOSIS — F329 Major depressive disorder, single episode, unspecified: Secondary | ICD-10-CM | POA: Diagnosis not present

## 2013-01-08 DIAGNOSIS — D509 Iron deficiency anemia, unspecified: Secondary | ICD-10-CM | POA: Diagnosis not present

## 2013-02-02 ENCOUNTER — Other Ambulatory Visit: Payer: Self-pay | Admitting: Gastroenterology

## 2013-02-02 DIAGNOSIS — D1330 Benign neoplasm of unspecified part of small intestine: Secondary | ICD-10-CM | POA: Diagnosis not present

## 2013-02-02 DIAGNOSIS — K648 Other hemorrhoids: Secondary | ICD-10-CM | POA: Diagnosis not present

## 2013-02-02 DIAGNOSIS — K5730 Diverticulosis of large intestine without perforation or abscess without bleeding: Secondary | ICD-10-CM | POA: Diagnosis not present

## 2013-02-02 DIAGNOSIS — D509 Iron deficiency anemia, unspecified: Secondary | ICD-10-CM | POA: Diagnosis not present

## 2013-02-02 DIAGNOSIS — K2970 Gastritis, unspecified, without bleeding: Secondary | ICD-10-CM | POA: Diagnosis not present

## 2013-02-09 DIAGNOSIS — D649 Anemia, unspecified: Secondary | ICD-10-CM | POA: Diagnosis not present

## 2013-02-15 ENCOUNTER — Other Ambulatory Visit (HOSPITAL_COMMUNITY): Payer: Self-pay | Admitting: Family Medicine

## 2013-02-15 DIAGNOSIS — Z1231 Encounter for screening mammogram for malignant neoplasm of breast: Secondary | ICD-10-CM

## 2013-03-03 ENCOUNTER — Ambulatory Visit (HOSPITAL_COMMUNITY)
Admission: RE | Admit: 2013-03-03 | Discharge: 2013-03-03 | Disposition: A | Discharge status: Home / Self Care | Payer: Medicare Other | Source: Ambulatory Visit | Attending: Family Medicine | Admitting: Family Medicine

## 2013-03-03 ENCOUNTER — Other Ambulatory Visit (HOSPITAL_COMMUNITY): Payer: Self-pay | Admitting: Family Medicine

## 2013-03-03 DIAGNOSIS — Z1231 Encounter for screening mammogram for malignant neoplasm of breast: Secondary | ICD-10-CM | POA: Diagnosis not present

## 2013-03-08 DIAGNOSIS — D509 Iron deficiency anemia, unspecified: Secondary | ICD-10-CM | POA: Diagnosis not present

## 2013-03-08 DIAGNOSIS — K589 Irritable bowel syndrome without diarrhea: Secondary | ICD-10-CM | POA: Diagnosis not present

## 2013-03-25 DIAGNOSIS — F329 Major depressive disorder, single episode, unspecified: Secondary | ICD-10-CM | POA: Diagnosis not present

## 2013-03-25 DIAGNOSIS — E7800 Pure hypercholesterolemia, unspecified: Secondary | ICD-10-CM | POA: Diagnosis not present

## 2013-03-25 DIAGNOSIS — I10 Essential (primary) hypertension: Secondary | ICD-10-CM | POA: Diagnosis not present

## 2013-03-25 DIAGNOSIS — Z23 Encounter for immunization: Secondary | ICD-10-CM | POA: Diagnosis not present

## 2013-06-24 DIAGNOSIS — F17200 Nicotine dependence, unspecified, uncomplicated: Secondary | ICD-10-CM | POA: Diagnosis not present

## 2013-06-24 DIAGNOSIS — E7800 Pure hypercholesterolemia, unspecified: Secondary | ICD-10-CM | POA: Diagnosis not present

## 2013-06-24 DIAGNOSIS — I10 Essential (primary) hypertension: Secondary | ICD-10-CM | POA: Diagnosis not present

## 2013-06-24 DIAGNOSIS — IMO0001 Reserved for inherently not codable concepts without codable children: Secondary | ICD-10-CM | POA: Diagnosis not present

## 2013-06-24 DIAGNOSIS — F329 Major depressive disorder, single episode, unspecified: Secondary | ICD-10-CM | POA: Diagnosis not present

## 2013-06-24 DIAGNOSIS — F3289 Other specified depressive episodes: Secondary | ICD-10-CM | POA: Diagnosis not present

## 2013-08-03 DIAGNOSIS — K589 Irritable bowel syndrome without diarrhea: Secondary | ICD-10-CM | POA: Diagnosis not present

## 2013-08-03 DIAGNOSIS — D509 Iron deficiency anemia, unspecified: Secondary | ICD-10-CM | POA: Diagnosis not present

## 2013-08-03 DIAGNOSIS — K861 Other chronic pancreatitis: Secondary | ICD-10-CM | POA: Diagnosis not present

## 2013-08-03 DIAGNOSIS — R159 Full incontinence of feces: Secondary | ICD-10-CM | POA: Diagnosis not present

## 2013-10-06 DIAGNOSIS — F17200 Nicotine dependence, unspecified, uncomplicated: Secondary | ICD-10-CM | POA: Diagnosis not present

## 2013-10-06 DIAGNOSIS — R159 Full incontinence of feces: Secondary | ICD-10-CM | POA: Diagnosis not present

## 2013-10-06 DIAGNOSIS — IMO0001 Reserved for inherently not codable concepts without codable children: Secondary | ICD-10-CM | POA: Diagnosis not present

## 2013-10-06 DIAGNOSIS — Z0000 Encounter for general adult medical examination without abnormal findings: Secondary | ICD-10-CM | POA: Diagnosis not present

## 2013-10-06 DIAGNOSIS — G4700 Insomnia, unspecified: Secondary | ICD-10-CM | POA: Diagnosis not present

## 2013-10-06 DIAGNOSIS — Z23 Encounter for immunization: Secondary | ICD-10-CM | POA: Diagnosis not present

## 2013-10-06 DIAGNOSIS — E7800 Pure hypercholesterolemia, unspecified: Secondary | ICD-10-CM | POA: Diagnosis not present

## 2013-10-06 DIAGNOSIS — F329 Major depressive disorder, single episode, unspecified: Secondary | ICD-10-CM | POA: Diagnosis not present

## 2013-10-06 DIAGNOSIS — F3289 Other specified depressive episodes: Secondary | ICD-10-CM | POA: Diagnosis not present

## 2013-10-06 DIAGNOSIS — I10 Essential (primary) hypertension: Secondary | ICD-10-CM | POA: Diagnosis not present

## 2014-01-11 DIAGNOSIS — E119 Type 2 diabetes mellitus without complications: Secondary | ICD-10-CM | POA: Diagnosis not present

## 2014-01-11 DIAGNOSIS — H2510 Age-related nuclear cataract, unspecified eye: Secondary | ICD-10-CM | POA: Diagnosis not present

## 2014-01-19 DIAGNOSIS — F329 Major depressive disorder, single episode, unspecified: Secondary | ICD-10-CM | POA: Diagnosis not present

## 2014-01-19 DIAGNOSIS — D509 Iron deficiency anemia, unspecified: Secondary | ICD-10-CM | POA: Diagnosis not present

## 2014-01-19 DIAGNOSIS — F17200 Nicotine dependence, unspecified, uncomplicated: Secondary | ICD-10-CM | POA: Diagnosis not present

## 2014-01-19 DIAGNOSIS — F3289 Other specified depressive episodes: Secondary | ICD-10-CM | POA: Diagnosis not present

## 2014-01-19 DIAGNOSIS — F411 Generalized anxiety disorder: Secondary | ICD-10-CM | POA: Diagnosis not present

## 2014-01-19 DIAGNOSIS — E7800 Pure hypercholesterolemia, unspecified: Secondary | ICD-10-CM | POA: Diagnosis not present

## 2014-01-19 DIAGNOSIS — L84 Corns and callosities: Secondary | ICD-10-CM | POA: Diagnosis not present

## 2014-01-19 DIAGNOSIS — IMO0001 Reserved for inherently not codable concepts without codable children: Secondary | ICD-10-CM | POA: Diagnosis not present

## 2014-01-19 DIAGNOSIS — I10 Essential (primary) hypertension: Secondary | ICD-10-CM | POA: Diagnosis not present

## 2014-01-24 DIAGNOSIS — E1122 Type 2 diabetes mellitus with diabetic chronic kidney disease: Secondary | ICD-10-CM | POA: Diagnosis not present

## 2014-02-02 DIAGNOSIS — D509 Iron deficiency anemia, unspecified: Secondary | ICD-10-CM | POA: Diagnosis not present

## 2014-02-02 DIAGNOSIS — K580 Irritable bowel syndrome with diarrhea: Secondary | ICD-10-CM | POA: Diagnosis not present

## 2014-02-02 DIAGNOSIS — K861 Other chronic pancreatitis: Secondary | ICD-10-CM | POA: Diagnosis not present

## 2014-05-02 DIAGNOSIS — Z23 Encounter for immunization: Secondary | ICD-10-CM | POA: Diagnosis not present

## 2014-05-02 DIAGNOSIS — E1165 Type 2 diabetes mellitus with hyperglycemia: Secondary | ICD-10-CM | POA: Diagnosis not present

## 2014-05-02 DIAGNOSIS — E780 Pure hypercholesterolemia: Secondary | ICD-10-CM | POA: Diagnosis not present

## 2014-05-02 DIAGNOSIS — I10 Essential (primary) hypertension: Secondary | ICD-10-CM | POA: Diagnosis not present

## 2014-05-02 DIAGNOSIS — N183 Chronic kidney disease, stage 3 (moderate): Secondary | ICD-10-CM | POA: Diagnosis not present

## 2014-05-02 DIAGNOSIS — D509 Iron deficiency anemia, unspecified: Secondary | ICD-10-CM | POA: Diagnosis not present

## 2014-05-02 DIAGNOSIS — F329 Major depressive disorder, single episode, unspecified: Secondary | ICD-10-CM | POA: Diagnosis not present

## 2014-05-02 DIAGNOSIS — F17210 Nicotine dependence, cigarettes, uncomplicated: Secondary | ICD-10-CM | POA: Diagnosis not present

## 2014-05-12 DIAGNOSIS — Q6689 Other  specified congenital deformities of feet: Secondary | ICD-10-CM | POA: Diagnosis not present

## 2014-05-12 DIAGNOSIS — M2042 Other hammer toe(s) (acquired), left foot: Secondary | ICD-10-CM | POA: Diagnosis not present

## 2014-05-12 DIAGNOSIS — Z720 Tobacco use: Secondary | ICD-10-CM | POA: Diagnosis not present

## 2014-05-12 DIAGNOSIS — E119 Type 2 diabetes mellitus without complications: Secondary | ICD-10-CM | POA: Diagnosis not present

## 2014-08-04 DIAGNOSIS — Z720 Tobacco use: Secondary | ICD-10-CM | POA: Diagnosis not present

## 2014-08-04 DIAGNOSIS — Q6689 Other  specified congenital deformities of feet: Secondary | ICD-10-CM | POA: Diagnosis not present

## 2014-08-04 DIAGNOSIS — E119 Type 2 diabetes mellitus without complications: Secondary | ICD-10-CM | POA: Diagnosis not present

## 2014-08-04 DIAGNOSIS — M2042 Other hammer toe(s) (acquired), left foot: Secondary | ICD-10-CM | POA: Diagnosis not present

## 2014-09-23 DIAGNOSIS — Z720 Tobacco use: Secondary | ICD-10-CM | POA: Diagnosis not present

## 2014-09-23 DIAGNOSIS — E119 Type 2 diabetes mellitus without complications: Secondary | ICD-10-CM | POA: Diagnosis not present

## 2014-09-23 DIAGNOSIS — Q6689 Other  specified congenital deformities of feet: Secondary | ICD-10-CM | POA: Diagnosis not present

## 2014-10-10 DIAGNOSIS — N952 Postmenopausal atrophic vaginitis: Secondary | ICD-10-CM | POA: Diagnosis not present

## 2014-10-10 DIAGNOSIS — I10 Essential (primary) hypertension: Secondary | ICD-10-CM | POA: Diagnosis not present

## 2014-10-10 DIAGNOSIS — F330 Major depressive disorder, recurrent, mild: Secondary | ICD-10-CM | POA: Diagnosis not present

## 2014-10-10 DIAGNOSIS — N183 Chronic kidney disease, stage 3 (moderate): Secondary | ICD-10-CM | POA: Diagnosis not present

## 2014-10-10 DIAGNOSIS — D509 Iron deficiency anemia, unspecified: Secondary | ICD-10-CM | POA: Diagnosis not present

## 2014-10-10 DIAGNOSIS — E780 Pure hypercholesterolemia: Secondary | ICD-10-CM | POA: Diagnosis not present

## 2014-10-10 DIAGNOSIS — Z0000 Encounter for general adult medical examination without abnormal findings: Secondary | ICD-10-CM | POA: Diagnosis not present

## 2014-10-10 DIAGNOSIS — F17210 Nicotine dependence, cigarettes, uncomplicated: Secondary | ICD-10-CM | POA: Diagnosis not present

## 2014-10-10 DIAGNOSIS — E1165 Type 2 diabetes mellitus with hyperglycemia: Secondary | ICD-10-CM | POA: Diagnosis not present

## 2014-10-10 DIAGNOSIS — G4700 Insomnia, unspecified: Secondary | ICD-10-CM | POA: Diagnosis not present

## 2014-10-11 ENCOUNTER — Other Ambulatory Visit (HOSPITAL_COMMUNITY): Payer: Self-pay | Admitting: *Deleted

## 2014-10-11 DIAGNOSIS — IMO0002 Reserved for concepts with insufficient information to code with codable children: Secondary | ICD-10-CM

## 2014-10-11 DIAGNOSIS — E1165 Type 2 diabetes mellitus with hyperglycemia: Secondary | ICD-10-CM

## 2014-10-11 DIAGNOSIS — R0989 Other specified symptoms and signs involving the circulatory and respiratory systems: Secondary | ICD-10-CM

## 2014-10-12 ENCOUNTER — Ambulatory Visit (HOSPITAL_COMMUNITY): Payer: Medicare Other | Attending: Cardiology

## 2014-10-12 DIAGNOSIS — E1165 Type 2 diabetes mellitus with hyperglycemia: Secondary | ICD-10-CM | POA: Insufficient documentation

## 2014-10-12 DIAGNOSIS — R0989 Other specified symptoms and signs involving the circulatory and respiratory systems: Secondary | ICD-10-CM

## 2014-10-12 DIAGNOSIS — IMO0002 Reserved for concepts with insufficient information to code with codable children: Secondary | ICD-10-CM

## 2014-10-26 ENCOUNTER — Ambulatory Visit (INDEPENDENT_AMBULATORY_CARE_PROVIDER_SITE_OTHER): Payer: Medicare Other | Admitting: Podiatry

## 2014-10-26 VITALS — BP 116/70 | HR 77 | Resp 14

## 2014-10-26 DIAGNOSIS — Q828 Other specified congenital malformations of skin: Secondary | ICD-10-CM

## 2014-10-26 NOTE — Progress Notes (Signed)
Patient ID: Sara Weeks, female   DOB: 09/28/1945, 69 y.o.   MRN: [id number] The callus on the bottom of my left foot has started to pain me agaiin. This patient presents to the oiffice with painful callus under ball of left foot.  She says she had vascular studies performed at [facility].      Objective: Review of past medical history, medications, social history and allergies were performed.  Vascular: Dorsalis pedis and posterior tibial pulses were palpable B/L, capillary refill was  WNL B/L, temperature gradient was WNL B/L   Skin:  No signs of symptoms of infection or ulcers on both feet  Nails: appear healthy with no signs of mycosis or infections  Sensory: Semmes Weinstein monifilament WNL   Orthopedic: Orthopedic evaluation demonstrates all joints distal t ankle have full ROM without crepitus, muscle power WNL B/L  A. Porokeraosis left foot  Debridement of porokeratosis

## 2014-11-23 ENCOUNTER — Other Ambulatory Visit: Payer: Self-pay | Admitting: Family Medicine

## 2014-11-23 ENCOUNTER — Other Ambulatory Visit (HOSPITAL_COMMUNITY): Payer: Self-pay | Admitting: Family Medicine

## 2014-11-23 DIAGNOSIS — Z1231 Encounter for screening mammogram for malignant neoplasm of breast: Secondary | ICD-10-CM

## 2014-12-07 ENCOUNTER — Ambulatory Visit (HOSPITAL_COMMUNITY)
Admission: RE | Admit: 2014-12-07 | Discharge: 2014-12-07 | Disposition: A | Discharge status: Home / Self Care | Payer: Medicare Other | Source: Ambulatory Visit | Attending: Family Medicine | Admitting: Family Medicine

## 2014-12-07 DIAGNOSIS — Z1231 Encounter for screening mammogram for malignant neoplasm of breast: Secondary | ICD-10-CM

## 2014-12-20 DIAGNOSIS — M859 Disorder of bone density and structure, unspecified: Secondary | ICD-10-CM | POA: Diagnosis not present

## 2014-12-20 DIAGNOSIS — M8589 Other specified disorders of bone density and structure, multiple sites: Secondary | ICD-10-CM | POA: Diagnosis not present

## 2015-01-04 ENCOUNTER — Ambulatory Visit: Payer: Medicare Other | Admitting: Podiatry

## 2015-01-18 DIAGNOSIS — F330 Major depressive disorder, recurrent, mild: Secondary | ICD-10-CM | POA: Diagnosis not present

## 2015-01-18 DIAGNOSIS — Z23 Encounter for immunization: Secondary | ICD-10-CM | POA: Diagnosis not present

## 2015-01-18 DIAGNOSIS — E1165 Type 2 diabetes mellitus with hyperglycemia: Secondary | ICD-10-CM | POA: Diagnosis not present

## 2015-01-18 DIAGNOSIS — E780 Pure hypercholesterolemia: Secondary | ICD-10-CM | POA: Diagnosis not present

## 2015-01-18 DIAGNOSIS — I10 Essential (primary) hypertension: Secondary | ICD-10-CM | POA: Diagnosis not present

## 2015-01-18 DIAGNOSIS — F17210 Nicotine dependence, cigarettes, uncomplicated: Secondary | ICD-10-CM | POA: Diagnosis not present

## 2015-01-18 DIAGNOSIS — E1122 Type 2 diabetes mellitus with diabetic chronic kidney disease: Secondary | ICD-10-CM | POA: Diagnosis not present

## 2015-01-18 DIAGNOSIS — D509 Iron deficiency anemia, unspecified: Secondary | ICD-10-CM | POA: Diagnosis not present

## 2015-05-11 DIAGNOSIS — E1165 Type 2 diabetes mellitus with hyperglycemia: Secondary | ICD-10-CM | POA: Diagnosis not present

## 2015-05-11 DIAGNOSIS — E7800 Pure hypercholesterolemia, unspecified: Secondary | ICD-10-CM | POA: Diagnosis not present

## 2015-05-11 DIAGNOSIS — F17210 Nicotine dependence, cigarettes, uncomplicated: Secondary | ICD-10-CM | POA: Diagnosis not present

## 2015-05-11 DIAGNOSIS — R5383 Other fatigue: Secondary | ICD-10-CM | POA: Diagnosis not present

## 2015-05-11 DIAGNOSIS — F322 Major depressive disorder, single episode, severe without psychotic features: Secondary | ICD-10-CM | POA: Diagnosis not present

## 2015-05-11 DIAGNOSIS — D649 Anemia, unspecified: Secondary | ICD-10-CM | POA: Diagnosis not present

## 2015-05-11 DIAGNOSIS — I10 Essential (primary) hypertension: Secondary | ICD-10-CM | POA: Diagnosis not present

## 2015-05-11 DIAGNOSIS — Z7984 Long term (current) use of oral hypoglycemic drugs: Secondary | ICD-10-CM | POA: Diagnosis not present

## 2015-05-24 DIAGNOSIS — E1165 Type 2 diabetes mellitus with hyperglycemia: Secondary | ICD-10-CM | POA: Diagnosis not present

## 2015-06-16 DIAGNOSIS — E782 Mixed hyperlipidemia: Secondary | ICD-10-CM | POA: Diagnosis not present

## 2015-06-16 DIAGNOSIS — Z79899 Other long term (current) drug therapy: Secondary | ICD-10-CM | POA: Diagnosis not present

## 2015-06-16 DIAGNOSIS — E1165 Type 2 diabetes mellitus with hyperglycemia: Secondary | ICD-10-CM | POA: Diagnosis not present

## 2015-06-20 DIAGNOSIS — M2042 Other hammer toe(s) (acquired), left foot: Secondary | ICD-10-CM | POA: Diagnosis not present

## 2015-06-20 DIAGNOSIS — E1165 Type 2 diabetes mellitus with hyperglycemia: Secondary | ICD-10-CM | POA: Diagnosis not present

## 2015-06-20 DIAGNOSIS — E7800 Pure hypercholesterolemia, unspecified: Secondary | ICD-10-CM | POA: Diagnosis not present

## 2015-06-20 DIAGNOSIS — E1142 Type 2 diabetes mellitus with diabetic polyneuropathy: Secondary | ICD-10-CM | POA: Diagnosis not present

## 2015-06-20 DIAGNOSIS — I10 Essential (primary) hypertension: Secondary | ICD-10-CM | POA: Diagnosis not present

## 2015-06-20 DIAGNOSIS — F17210 Nicotine dependence, cigarettes, uncomplicated: Secondary | ICD-10-CM | POA: Diagnosis not present

## 2015-06-20 DIAGNOSIS — E876 Hypokalemia: Secondary | ICD-10-CM | POA: Diagnosis not present

## 2015-07-04 DIAGNOSIS — E876 Hypokalemia: Secondary | ICD-10-CM | POA: Diagnosis not present

## 2015-07-10 DIAGNOSIS — F17210 Nicotine dependence, cigarettes, uncomplicated: Secondary | ICD-10-CM | POA: Diagnosis not present

## 2015-07-10 DIAGNOSIS — I10 Essential (primary) hypertension: Secondary | ICD-10-CM | POA: Diagnosis not present

## 2015-07-10 DIAGNOSIS — E1142 Type 2 diabetes mellitus with diabetic polyneuropathy: Secondary | ICD-10-CM | POA: Diagnosis not present

## 2015-07-10 DIAGNOSIS — M2042 Other hammer toe(s) (acquired), left foot: Secondary | ICD-10-CM | POA: Diagnosis not present

## 2015-07-10 DIAGNOSIS — E1165 Type 2 diabetes mellitus with hyperglycemia: Secondary | ICD-10-CM | POA: Diagnosis not present

## 2015-07-10 DIAGNOSIS — E876 Hypokalemia: Secondary | ICD-10-CM | POA: Diagnosis not present

## 2015-07-10 DIAGNOSIS — E7800 Pure hypercholesterolemia, unspecified: Secondary | ICD-10-CM | POA: Diagnosis not present

## 2015-08-24 ENCOUNTER — Ambulatory Visit (INDEPENDENT_AMBULATORY_CARE_PROVIDER_SITE_OTHER): Payer: Medicare Other | Admitting: Podiatry

## 2015-08-24 ENCOUNTER — Encounter: Payer: Self-pay | Admitting: Podiatry

## 2015-08-24 DIAGNOSIS — Q828 Other specified congenital malformations of skin: Secondary | ICD-10-CM | POA: Diagnosis not present

## 2015-08-24 NOTE — Progress Notes (Signed)
Patient ID: Sara Weeks, female   DOB: 03-Nov-1945, 70 y.o.   MRN: [id number] The callus on the bottom of my left foot has started to pain me agaiin. This patient presents to the oiffice with painful callus under ball of left foot.  She says pain occurs walking and wearing her shoes.      Objective: Review of past medical history, medications, social history and allergies were performed.  Vascular: Dorsalis pedis and posterior tibial pulses were palpable B/L, capillary refill was  WNL B/L, temperature gradient was WNL B/L   Skin:  No signs of symptoms of infection or ulcers on both feet.  Diffuse plantar tyloma left foot.  Nails: appear healthy with no signs of mycosis or infections  Sensory: Semmes Weinstein monifilament WNL   Orthopedic: Orthopedic evaluation demonstrates all joints distal t ankle have full ROM without crepitus, muscle power WNL B/L  A. Porokeraosis left foot  Debridement of porokeratosis  RTC 6 months     Gardiner Barefoot DPM

## 2015-10-12 DIAGNOSIS — M79601 Pain in right arm: Secondary | ICD-10-CM | POA: Diagnosis not present

## 2015-10-12 DIAGNOSIS — Z0000 Encounter for general adult medical examination without abnormal findings: Secondary | ICD-10-CM | POA: Diagnosis not present

## 2015-10-12 DIAGNOSIS — F17210 Nicotine dependence, cigarettes, uncomplicated: Secondary | ICD-10-CM | POA: Diagnosis not present

## 2015-10-12 DIAGNOSIS — I10 Essential (primary) hypertension: Secondary | ICD-10-CM | POA: Diagnosis not present

## 2015-10-12 DIAGNOSIS — F330 Major depressive disorder, recurrent, mild: Secondary | ICD-10-CM | POA: Diagnosis not present

## 2015-10-12 DIAGNOSIS — G4700 Insomnia, unspecified: Secondary | ICD-10-CM | POA: Diagnosis not present

## 2015-10-12 DIAGNOSIS — F419 Anxiety disorder, unspecified: Secondary | ICD-10-CM | POA: Diagnosis not present

## 2015-10-12 DIAGNOSIS — Z7984 Long term (current) use of oral hypoglycemic drugs: Secondary | ICD-10-CM | POA: Diagnosis not present

## 2015-10-12 DIAGNOSIS — E7800 Pure hypercholesterolemia, unspecified: Secondary | ICD-10-CM | POA: Diagnosis not present

## 2015-10-12 DIAGNOSIS — E1165 Type 2 diabetes mellitus with hyperglycemia: Secondary | ICD-10-CM | POA: Diagnosis not present

## 2015-10-25 ENCOUNTER — Ambulatory Visit
Admission: RE | Admit: 2015-10-25 | Discharge: 2015-10-25 | Disposition: A | Discharge status: Home / Self Care | Payer: Medicare Other | Source: Ambulatory Visit | Attending: Family Medicine | Admitting: Family Medicine

## 2015-10-25 ENCOUNTER — Other Ambulatory Visit: Payer: Self-pay | Admitting: Family Medicine

## 2015-10-25 DIAGNOSIS — M25511 Pain in right shoulder: Secondary | ICD-10-CM

## 2015-10-25 DIAGNOSIS — M19011 Primary osteoarthritis, right shoulder: Secondary | ICD-10-CM | POA: Diagnosis not present

## 2015-11-29 DIAGNOSIS — F330 Major depressive disorder, recurrent, mild: Secondary | ICD-10-CM | POA: Diagnosis not present

## 2015-11-29 DIAGNOSIS — E1165 Type 2 diabetes mellitus with hyperglycemia: Secondary | ICD-10-CM | POA: Diagnosis not present

## 2015-11-29 DIAGNOSIS — M25511 Pain in right shoulder: Secondary | ICD-10-CM | POA: Diagnosis not present

## 2015-11-29 DIAGNOSIS — D649 Anemia, unspecified: Secondary | ICD-10-CM | POA: Diagnosis not present

## 2015-11-30 DIAGNOSIS — M25511 Pain in right shoulder: Secondary | ICD-10-CM | POA: Diagnosis not present

## 2015-12-21 DIAGNOSIS — E876 Hypokalemia: Secondary | ICD-10-CM | POA: Diagnosis not present

## 2016-01-18 ENCOUNTER — Other Ambulatory Visit: Payer: Self-pay | Admitting: Family Medicine

## 2016-01-18 DIAGNOSIS — Z1231 Encounter for screening mammogram for malignant neoplasm of breast: Secondary | ICD-10-CM

## 2016-01-23 DIAGNOSIS — M25511 Pain in right shoulder: Secondary | ICD-10-CM | POA: Diagnosis not present

## 2016-01-24 ENCOUNTER — Other Ambulatory Visit: Payer: Self-pay | Admitting: Orthopedic Surgery

## 2016-01-24 DIAGNOSIS — M25511 Pain in right shoulder: Secondary | ICD-10-CM

## 2016-01-31 ENCOUNTER — Ambulatory Visit: Payer: Medicare Other

## 2016-01-31 DIAGNOSIS — M25511 Pain in right shoulder: Secondary | ICD-10-CM | POA: Diagnosis not present

## 2016-02-03 ENCOUNTER — Other Ambulatory Visit: Payer: Medicare Other

## 2016-02-14 DIAGNOSIS — M25311 Other instability, right shoulder: Secondary | ICD-10-CM | POA: Diagnosis not present

## 2016-02-15 ENCOUNTER — Ambulatory Visit: Payer: Medicare Other

## 2016-02-21 DIAGNOSIS — E876 Hypokalemia: Secondary | ICD-10-CM | POA: Diagnosis not present

## 2016-02-28 DIAGNOSIS — Z01818 Encounter for other preprocedural examination: Secondary | ICD-10-CM | POA: Diagnosis not present

## 2016-02-28 DIAGNOSIS — Z7984 Long term (current) use of oral hypoglycemic drugs: Secondary | ICD-10-CM | POA: Diagnosis not present

## 2016-02-28 DIAGNOSIS — E7800 Pure hypercholesterolemia, unspecified: Secondary | ICD-10-CM | POA: Diagnosis not present

## 2016-02-28 DIAGNOSIS — I10 Essential (primary) hypertension: Secondary | ICD-10-CM | POA: Diagnosis not present

## 2016-02-28 DIAGNOSIS — Z23 Encounter for immunization: Secondary | ICD-10-CM | POA: Diagnosis not present

## 2016-02-28 DIAGNOSIS — E1165 Type 2 diabetes mellitus with hyperglycemia: Secondary | ICD-10-CM | POA: Diagnosis not present

## 2016-03-01 ENCOUNTER — Encounter (INDEPENDENT_AMBULATORY_CARE_PROVIDER_SITE_OTHER): Payer: Self-pay

## 2016-03-01 ENCOUNTER — Ambulatory Visit (INDEPENDENT_AMBULATORY_CARE_PROVIDER_SITE_OTHER): Payer: Medicare Other | Admitting: Cardiology

## 2016-03-01 ENCOUNTER — Encounter: Payer: Self-pay | Admitting: Cardiology

## 2016-03-01 VITALS — BP 130/76 | HR 85 | Ht 60.0 in | Wt 96.0 lb

## 2016-03-01 DIAGNOSIS — Z01810 Encounter for preprocedural cardiovascular examination: Secondary | ICD-10-CM | POA: Diagnosis not present

## 2016-03-01 DIAGNOSIS — R9431 Abnormal electrocardiogram [ECG] [EKG]: Secondary | ICD-10-CM

## 2016-03-01 NOTE — Patient Instructions (Signed)
Medication Instructions:  The current medical regimen is effective;  continue present plan and medications.  Testing/Procedures: Your physician has requested that you have a cardiac MRI (ASAP to clear your for surgery). Cardiac MRI uses a computer to create images of your heart as its beating, producing both still and moving pictures of your heart and major blood vessels. For further information please visit [email or web address] Please follow the instruction sheet given to you today for more information.  Follow-Up: Follow up as needed with Dr Marlou Porch after testing.  If you need a refill on your cardiac medications before your next appointment, please call your pharmacy.  Thank you for choosing [facility]!!

## 2016-03-01 NOTE — Progress Notes (Signed)
Cardiology Office Note    Date:  03/01/2016   ID:  Sara Weeks, DOB 21-Jan-1946, MRN [id number]  PCP:  Marjorie Smolder, MD  Cardiologist:   Candee Furbish, MD   No chief complaint on file.   History of Present Illness:  Sara Weeks is a 70 y.o. female here for preoperative evaluation prior to breast surgery, and evaluation of abnormal EKG. She has deeply inverted T waves diffusely. Denies any chest pain, shortness of breath, syncope. No early family history of CAD. She was seen by Darcus Austin, M.D. who called to have [place] review the EKG. She is able to complete greater than 4 METS of activity. She does have diabetes as a risk factor. She smokes as well.  I discussed her EKG with a colleague and there is a thought that this may be an apical variant hypertrophic cardiomyopathy pattern.  She has no symptoms, no chest pain, no exertional angina, no shortness of breath, no unexplained syncope. No cousins or siblings with early sudden cardiac death. She believes that  Maybe 20-25 years ago she had a surgery in [place] and they likely performed an EKG before then. Does not recall ever being told anything abnormal about an EKG in the past.  She did have 2 brothers who had heart disease, one of which was diagnosed with cancer and was also treated by a cardiologist and another that was diagnosed with epilepsy and had some sort of trouble with his heart. None of them had defibrillators or experienced sudden cardiac death.  Her mother had a heart attack in her later years.  Past Medical History:  Diagnosis Date  . Diabetes mellitus   . Hx breast cancer, DCIS, L 08/17/2001    Past Surgical History:  Procedure Laterality Date  . BREAST LUMPECTOMY W/ NEEDLE LOCALIZATION  08/20/2001   left  . EXPLORATORY LAPAROTOMY W/ BOWEL RESECTION  08/13/09    Current Medications: Outpatient Medications Prior to Visit  Medication Sig Dispense Refill  . ALPRAZolam (XANAX) 0.25 MG tablet  take 1/2 tablet by mouth every 8 hours if needed for anxiety  0  . amitriptyline (ELAVIL) 75 MG tablet Take by mouth at bedtime.      Marland Kitchen aspirin 81 MG tablet Take 81 mg by mouth daily.      Marland Kitchen atorvastatin (LIPITOR) 40 MG tablet   0  . ferrous sulfate 325 (65 FE) MG tablet Take 325 mg by mouth daily.  0  . FREESTYLE LITE test strip TEST BLOOD SUGARS THREE TIMES A DAY OR AS DIRECTED  1  . hyoscyamine (ANASPAZ) 0.125 MG TBDP Place 0.125 mg under the tongue every 6 (six) hours as needed.      . hyoscyamine (LEVSIN SL) 0.125 MG SL tablet Place 0.125 mg under the tongue every 4 (four) hours as needed.      . Lancets (FREESTYLE) lancets USE TO TEST BLOOD SUGARS THREE TIMES A DAY  1  . lisinopril-hydrochlorothiazide (PRINZIDE,ZESTORETIC) 20-25 MG per tablet 1 tablet daily.     . metFORMIN (GLUCOPHAGE-XR) 500 MG 24 hr tablet Take 1,000 mg by mouth 2 (two) times daily.  0  . pioglitazone (ACTOS) 30 MG tablet take 1 tablet by mouth once daily for diabetes  0  . escitalopram (LEXAPRO) 20 MG tablet 20 mg daily.      No facility-administered medications prior to visit.      Allergies:   Penicillins   Social History   Social History  . Marital status:  Married    Spouse name: N/A  . Number of children: N/A  . Years of education: N/A   Social History Main Topics  . Smoking status: Current Every Day Smoker    Packs/day: 1.00  . Smokeless tobacco: Never Used  . Alcohol use No  . Drug use: No  . Sexual activity: Not Asked   Other Topics Concern  . None   Social History Narrative  . None     Family History:  The patient's family history includes Heart attack in her mother; Prostate cancer in her father.   ROS:   Please see the history of present illness.    ROS All other systems reviewed and are negative.   PHYSICAL EXAM:   VS:  BP 130/76   Pulse 85   Ht 5' (1.524 m)   Wt 96 lb (43.5 kg)   LMP  (LMP Unknown)   BMI 18.75 kg/m    GEN: Thin, in no acute distress  HEENT: normal    Neck: no JVD, carotid bruits, or masses Cardiac: RRR; no murmurs, rubs, or gallops,no edema  Respiratory:  clear to auscultation bilaterally, normal work of breathing GI: soft, nontender, nondistended, + BS MS: no deformity or atrophy  Skin: warm and dry, no rash Neuro:  Alert and Oriented x 3, Strength and sensation are intact Psych: euthymic mood, full affect  Wt Readings from Last 3 Encounters:  03/01/16 96 lb (43.5 kg)  03/22/11 95 lb (43.1 kg)      Studies/Labs Reviewed:   EKG:  EKG is not ordered today.  The ekg from 02/28/16 shows sinus rhythm, T-wave inversion diffusely with highest amplitude being in V4. Heart rate 90 bpm. Personally viewed.  Recent Labs: No results found for requested labs within last 8760 hours.   Lipid Panel    Component Value Date/Time   CHOL  01/07/2009 0515    120        ATP III CLASSIFICATION:  <200     mg/dL   Desirable  200-239  mg/dL   Borderline High  >=240    mg/dL   High          TRIG 85 01/07/2009 0515   HDL 44 01/07/2009 0515   CHOLHDL 2.7 01/07/2009 0515   VLDL 17 01/07/2009 0515   LDLCALC  01/07/2009 0515    59        Total Cholesterol/HDL:CHD Risk Coronary Heart Disease Risk Table                     Men   Women  1/2 Average Risk   3.4   3.3  Average Risk       5.0   4.4  2 X Average Risk   9.6   7.1  3 X Average Risk  23.4   11.0        Use the calculated Patient Ratio above and the CHD Risk Table to determine the patient's CHD Risk.        ATP III CLASSIFICATION (LDL):  <100     mg/dL   Optimal  100-129  mg/dL   Near or Above                    Optimal  130-159  mg/dL   Borderline  160-189  mg/dL   High  >190     mg/dL   Very High    Additional studies/ records that were reviewed today include:  Prior office  notes, EKGs reviewed, lab work reviewed  Labs on 02/28/2016 showed hemoglobin A1c of 8.9, creatinine 1.02, sodium 141, potassium 4.5, ALT 9, total cholesterol 104, LDL 47, HDL 39, triglycerides 92. These  were performed at [facility] labs.  ASSESSMENT:    1. Abnormal EKG   2. Pre-operative cardiovascular examination      PLAN:  In order of problems listed above:  Abnormal EKG  - Could be indicative of apical hypertrophy variant of hypertrophic cardiomyopathy.  - Explained this in detail physiologically to them.  - I do not hear any significant murmurs.  - We will check cardiac MRI for further clarification, looking force-like configuration. She does have classic child negative T waves on ECG particularly in the left precordial leads. Apical hypertrophy is typically associated with low risk and a most never requires ICD placement for primary prevention.  - If apical hypertrophy is noted, it would be nice to start her on Toprol-XL 12.5 mg once a day. Her blood pressure is quite soft.  - If there is no evidence of hypertrophy on cardiac MRI, I will likely order a stress test to ensure that there is no evidence of ischemia although with her lack of symptoms this seems less likely.  Preoperative cardiac risk prior to breast cancer surgery  - She is easily able to complete greater than 4 METS of activity without any exertional anginal symptoms.  - We are checking cardiac MRI as above   - After MRI is complete, she will likely be able to proceed with surgery with low overall cardiac risk.    Medication Adjustments/Labs and Tests Ordered: Current medicines are reviewed at length with the patient today.  Concerns regarding medicines are outlined above.  Medication changes, Labs and Tests ordered today are listed in the Patient Instructions below. Patient Instructions  Medication Instructions:  The current medical regimen is effective;  continue present plan and medications.  Testing/Procedures: Your physician has requested that you have a cardiac MRI (ASAP to clear your for surgery). Cardiac MRI uses a computer to create images of your heart as its beating, producing both still and moving pictures  of your heart and major blood vessels. For further information please visit [email or web address].[email or web address] Please follow the instruction sheet given to you today for more information.  Follow-Up: Follow up as needed with Dr Marlou Porch after testing.  If you need a refill on your cardiac medications before your next appointment, please call your pharmacy.  Thank you for choosing [facility]!!        Signed, Candee Furbish, MD  03/01/2016 12:04 PM    [address], [place], [place]  [address] Phone: [phone]; Fax: [phone]

## 2016-03-05 DIAGNOSIS — H2513 Age-related nuclear cataract, bilateral: Secondary | ICD-10-CM | POA: Diagnosis not present

## 2016-03-05 DIAGNOSIS — E119 Type 2 diabetes mellitus without complications: Secondary | ICD-10-CM | POA: Diagnosis not present

## 2016-03-06 ENCOUNTER — Encounter: Payer: Self-pay | Admitting: Cardiology

## 2016-03-06 ENCOUNTER — Telehealth: Payer: Self-pay | Admitting: Cardiology

## 2016-03-06 NOTE — Telephone Encounter (Signed)
Called patient and left voicemail with date, time and location of cardiac MRI.  Left my name and phone number if any questions.  Letter mailed today.

## 2016-03-12 ENCOUNTER — Ambulatory Visit (HOSPITAL_COMMUNITY)
Admission: RE | Admit: 2016-03-12 | Discharge: 2016-03-12 | Disposition: A | Discharge status: Home / Self Care | Payer: Medicare Other | Source: Ambulatory Visit | Attending: Cardiology | Admitting: Cardiology

## 2016-03-12 DIAGNOSIS — Z01810 Encounter for preprocedural cardiovascular examination: Secondary | ICD-10-CM | POA: Diagnosis not present

## 2016-03-12 DIAGNOSIS — I422 Other hypertrophic cardiomyopathy: Secondary | ICD-10-CM | POA: Diagnosis not present

## 2016-03-12 DIAGNOSIS — R9431 Abnormal electrocardiogram [ECG] [EKG]: Secondary | ICD-10-CM | POA: Diagnosis not present

## 2016-03-12 DIAGNOSIS — I081 Rheumatic disorders of both mitral and tricuspid valves: Secondary | ICD-10-CM | POA: Diagnosis not present

## 2016-03-12 LAB — CREATININE, SERUM
Creatinine, Ser: 1.35 mg/dL — ABNORMAL HIGH (ref 0.44–1.00)
GFR calc non Af Amer: 39 mL/min — ABNORMAL LOW (ref >60)
GFR, EST AFRICAN AMERICAN: 45 mL/min — AB (ref >60)

## 2016-03-12 MED ORDER — GADOBENATE DIMEGLUMINE 529 MG/ML IV SOLN
15.0000 mL | Freq: Once | INTRAVENOUS | Status: AC | PRN
  Administered 2016-03-12: 15 mL via INTRAVENOUS

## 2016-03-13 ENCOUNTER — Emergency Department (HOSPITAL_COMMUNITY)
Admission: EM | Admit: 2016-03-13 | Discharge: 2016-03-13 | Disposition: A | Discharge status: Home / Self Care | Payer: 59 | Attending: Emergency Medicine | Admitting: Emergency Medicine

## 2016-03-13 ENCOUNTER — Encounter (HOSPITAL_COMMUNITY): Payer: Self-pay | Admitting: Emergency Medicine

## 2016-03-13 ENCOUNTER — Emergency Department (HOSPITAL_COMMUNITY): Payer: 59

## 2016-03-13 ENCOUNTER — Telehealth: Payer: Self-pay | Admitting: *Deleted

## 2016-03-13 DIAGNOSIS — Z853 Personal history of malignant neoplasm of breast: Secondary | ICD-10-CM | POA: Diagnosis not present

## 2016-03-13 DIAGNOSIS — M79601 Pain in right arm: Secondary | ICD-10-CM | POA: Diagnosis not present

## 2016-03-13 DIAGNOSIS — Z7984 Long term (current) use of oral hypoglycemic drugs: Secondary | ICD-10-CM | POA: Insufficient documentation

## 2016-03-13 DIAGNOSIS — Z7982 Long term (current) use of aspirin: Secondary | ICD-10-CM | POA: Diagnosis not present

## 2016-03-13 DIAGNOSIS — Z79899 Other long term (current) drug therapy: Secondary | ICD-10-CM | POA: Diagnosis not present

## 2016-03-13 DIAGNOSIS — M542 Cervicalgia: Secondary | ICD-10-CM | POA: Diagnosis not present

## 2016-03-13 DIAGNOSIS — M549 Dorsalgia, unspecified: Secondary | ICD-10-CM | POA: Diagnosis present

## 2016-03-13 DIAGNOSIS — S4991XA Unspecified injury of right shoulder and upper arm, initial encounter: Secondary | ICD-10-CM | POA: Diagnosis not present

## 2016-03-13 DIAGNOSIS — M545 Low back pain: Secondary | ICD-10-CM | POA: Insufficient documentation

## 2016-03-13 DIAGNOSIS — F17200 Nicotine dependence, unspecified, uncomplicated: Secondary | ICD-10-CM | POA: Diagnosis not present

## 2016-03-13 DIAGNOSIS — Y92410 Unspecified street and highway as the place of occurrence of the external cause: Secondary | ICD-10-CM | POA: Insufficient documentation

## 2016-03-13 DIAGNOSIS — E119 Type 2 diabetes mellitus without complications: Secondary | ICD-10-CM | POA: Diagnosis not present

## 2016-03-13 DIAGNOSIS — Y999 Unspecified external cause status: Secondary | ICD-10-CM | POA: Insufficient documentation

## 2016-03-13 DIAGNOSIS — S3992XA Unspecified injury of lower back, initial encounter: Secondary | ICD-10-CM | POA: Diagnosis not present

## 2016-03-13 DIAGNOSIS — Y939 Activity, unspecified: Secondary | ICD-10-CM | POA: Insufficient documentation

## 2016-03-13 MED ORDER — DICLOFENAC SODIUM 75 MG PO TBEC: DELAYED_RELEASE_TABLET | ORAL | 0 refills | Status: DC

## 2016-03-13 MED ORDER — IBUPROFEN 800 MG PO TABS
800.0000 mg | ORAL_TABLET | Freq: Once | ORAL | Status: AC
  Administered 2016-03-13: 800 mg via ORAL
  Filled 2016-03-13: qty 1

## 2016-03-13 NOTE — Telephone Encounter (Signed)
-----   Message from Jerline Pain, MD sent at 03/13/2016  1:39 PM EST ----- Reassuring MRI, no evidence of hypertrophic cardiomyopathy. She may proceed with surgery. Candee Furbish, MD
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
## 2016-03-13 NOTE — ED Notes (Signed)
Pt concerned about how long she is waiting, delay explained to pt, offered blanket, repositioning, and beverage.

## 2016-03-13 NOTE — ED Notes (Signed)
Pt husband states he is ready to take the pt home. I explained that they would be signing out [place]. He states they will wait a little longer. I explained that we are waiting for xray results to come back.

## 2016-03-13 NOTE — Discharge Instructions (Signed)
Follow up with your family md or orthopedic md if needed
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
## 2016-03-13 NOTE — ED Notes (Signed)
Pt denies LOC, denies hitting her head.

## 2016-03-20 ENCOUNTER — Ambulatory Visit: Payer: Medicare Other

## 2016-03-20 DIAGNOSIS — M25311 Other instability, right shoulder: Secondary | ICD-10-CM | POA: Diagnosis not present

## 2016-03-20 DIAGNOSIS — M25511 Pain in right shoulder: Secondary | ICD-10-CM | POA: Diagnosis not present

## 2016-03-27 ENCOUNTER — Other Ambulatory Visit: Payer: Self-pay | Admitting: Orthopedic Surgery

## 2016-03-27 DIAGNOSIS — M25511 Pain in right shoulder: Secondary | ICD-10-CM

## 2016-03-28 ENCOUNTER — Ambulatory Visit
Admission: RE | Admit: 2016-03-28 | Discharge: 2016-03-28 | Disposition: A | Discharge status: Home / Self Care | Payer: Medicare Other | Source: Ambulatory Visit | Attending: Orthopedic Surgery | Admitting: Orthopedic Surgery

## 2016-03-28 DIAGNOSIS — M25511 Pain in right shoulder: Secondary | ICD-10-CM | POA: Diagnosis not present

## 2016-04-03 DIAGNOSIS — M25511 Pain in right shoulder: Secondary | ICD-10-CM | POA: Diagnosis not present

## 2016-04-03 DIAGNOSIS — M25311 Other instability, right shoulder: Secondary | ICD-10-CM | POA: Diagnosis not present

## 2016-05-01 DIAGNOSIS — M25512 Pain in left shoulder: Secondary | ICD-10-CM | POA: Diagnosis not present

## 2016-05-01 DIAGNOSIS — M25522 Pain in left elbow: Secondary | ICD-10-CM | POA: Diagnosis not present

## 2016-05-14 DIAGNOSIS — D509 Iron deficiency anemia, unspecified: Secondary | ICD-10-CM | POA: Diagnosis not present

## 2016-06-11 NOTE — H&P (Signed)
Sara Weeks is an 71 y.o. female.    Chief Complaint: right shoulder pain  HPI: Pt is a 71 y.o. female complaining of right shoulder pain for multiple years. Pain had continually increased since the beginning. X-rays in the clinic show end-stage arthritic changes of the right shoulder. Pt has tried various conservative treatments which have failed to alleviate their symptoms, including injections and therapy. Various options are discussed with the patient. Risks, benefits and expectations were discussed with the patient. Patient understand the risks, benefits and expectations and wishes to proceed with surgery.   PCP:  Marjorie Smolder, MD  D/C Plans: Home  PMH: Past Medical History:  Diagnosis Date  . Diabetes mellitus   . Hx breast cancer, DCIS, L 08/17/2001    PSH: Past Surgical History:  Procedure Laterality Date  . BREAST LUMPECTOMY W/ NEEDLE LOCALIZATION  08/20/2001   left  . EXPLORATORY LAPAROTOMY W/ BOWEL RESECTION  08/13/09    Social History:  reports that she has been smoking.  She has been smoking about 1.00 pack per day. She has never used smokeless tobacco. She reports that she does not drink alcohol or use drugs.  Allergies:  Allergies  Allergen Reactions  . Penicillins Rash    Rash on arms only. Has patient had a PCN reaction causing immediate rash, facial/tongue/throat swelling, SOB or lightheadedness with hypotension: No Has patient had a PCN reaction causing severe rash involving mucus membranes or skin necrosis: No Has patient had a PCN reaction that required hospitalization: No Has patient had a PCN reaction occurring within the last 10 years: No If all of the above answers are "NO", then may proceed with Cephalosporin use.     Medications: No current facility-administered medications for this encounter.    Current Outpatient Prescriptions  Medication Sig Dispense Refill  . ALPRAZolam (XANAX) 0.25 MG tablet take 0.125mg  by mouth every 8 hours if  needed for anxiety  0  . amitriptyline (ELAVIL) 75 MG tablet Take 75 mg by mouth at bedtime as needed for sleep.     Marland Kitchen aspirin 81 MG tablet Take 81 mg by mouth at bedtime.     Marland Kitchen atorvastatin (LIPITOR) 40 MG tablet Take 40 mg by mouth at bedtime.   0  . ferrous sulfate 325 (65 FE) MG tablet Take 325 mg by mouth every other day.   0  . hyoscyamine (ANASPAZ) 0.125 MG TBDP Place 0.125 mg under the tongue every 6 (six) hours as needed for cramping.     Marland Kitchen lisinopril-hydrochlorothiazide (PRINZIDE,ZESTORETIC) 20-25 MG per tablet Take 0.5 tablets by mouth at bedtime.     Marland Kitchen MELATONIN PO Take 2 tablets by mouth daily as needed.    . metFORMIN (GLUCOPHAGE-XR) 500 MG 24 hr tablet Take 1,000 mg by mouth 2 (two) times daily.  0  . Multiple Vitamin (MULTIVITAMIN WITH MINERALS) TABS tablet Take 1 tablet by mouth daily.    . potassium chloride (K-DUR) 10 MEQ tablet Take 10 mEq by mouth daily.    Marland Kitchen FREESTYLE LITE test strip TEST BLOOD SUGARS THREE TIMES A DAY OR AS DIRECTED  1  . Lancets (FREESTYLE) lancets USE TO TEST BLOOD SUGARS THREE TIMES A DAY  1    No results found for this or any previous visit (from the past 25 hour(s)). No results found.  ROS: Pain with rom of the right upper extremity  Physical Exam:  Alert and oriented 71 y.o. female in no acute distress Cranial nerves 2-12 intact Cervical spine: full  rom with no tenderness, nv intact distally Chest: active breath sounds bilaterally, no wheeze rhonchi or rales Heart: regular rate and rhythm, no murmur Abd: non tender non distended with active bowel sounds Hip is stable with rom  Right shoulder with limited rom and strength due to athropathy nv intact distally No rashes or edema  Assessment/Plan Assessment: right shoulder cuff arthropathy  Plan: Patient will undergo a right reverse total shoulder by Dr. Veverly Fells at [facility] Risks benefits and expectations were discussed with the patient. Patient understand risks, benefits and  expectations and wishes to proceed.

## 2016-06-12 NOTE — Pre-Procedure Instructions (Signed)
Sara Weeks  06/12/2016      [id number] - , [place]. [place] [address] Phone: [phone] Fax: [phone]    Your procedure is scheduled on March 2  Report to [facility] at [place].M.  Call this number if you have problems the morning of surgery:  [phone]   Remember:  Do not eat food or drink liquids after midnight.   Take these medicines the morning of surgery with A SIP OF WATER ALPRAZolam (XANAX)  If needed,   7 days prior to surgery STOP taking any Aspirin, Aleve, Naproxen, Ibuprofen, Motrin, Advil, Goody's, BC's, all herbal medications, fish oil, and all vitamins   WHAT DO I DO ABOUT MY DIABETES MEDICATION?   Marland Kitchen Do not take oral diabetes medicines (pills) the morning of surgery. METFORMIN  How to Manage Your Diabetes Before and After Surgery  Why is it important to control my blood sugar before and after surgery? . Improving blood sugar levels before and after surgery helps healing and can limit problems. . A way of improving blood sugar control is eating a healthy diet by: o  Eating less sugar and carbohydrates o  Increasing activity/exercise o  Talking with your doctor about reaching your blood sugar goals . High blood sugars (greater than 180 mg/dL) can raise your risk of infections and slow your recovery, so you will need to focus on controlling your diabetes during the weeks before surgery. . Make sure that the doctor who takes care of your diabetes knows about your planned surgery including the date and location.  How do I manage my blood sugar before surgery? . Check your blood sugar at least 4 times a day, starting 2 days before surgery, to make sure that the level is not too high or low. o Check your blood sugar the morning of your surgery when you wake up and every 2 hours until you get to the Short Stay unit. . If your blood sugar is less than 70  mg/dL, you will need to treat for low blood sugar: o Do not take insulin. o Treat a low blood sugar (less than 70 mg/dL) with  cup of clear juice (cranberry or apple), 4 glucose tablets, OR glucose gel. o Recheck blood sugar in 15 minutes after treatment (to make sure it is greater than 70 mg/dL). If your blood sugar is not greater than 70 mg/dL on recheck, call [phone] for further instructions. . Report your blood sugar to the short stay nurse when you get to Short Stay.  . If you are admitted to the hospital after surgery: o Your blood sugar will be checked by the staff and you will probably be given insulin after surgery (instead of oral diabetes medicines) to make sure you have good blood sugar levels. o The goal for blood sugar control after surgery is 80-180 mg/dL.    Do not wear jewelry.  Do not wear lotions, powders, or colgone, or deoderant.  Men may shave face and neck.  Do not bring valuables to the hospital.  [facility] is not responsible for any belongings or valuables.  Contacts, dentures or bridgework may not be worn into surgery.  Leave your suitcase in the car.  After surgery it may be brought to your room.  For patients admitted to the hospital, discharge time will be determined by your treatment team.  Patients discharged the day of surgery will not be  allowed to drive home.    Special instructions:   [facility]- Preparing For Surgery  Before surgery, you can play an important role. Because skin is not sterile, your skin needs to be as free of germs as possible. You can reduce the number of germs on your skin by washing with CHG (chlorahexidine gluconate) Soap before surgery.  CHG is an antiseptic cleaner which kills germs and bonds with the skin to continue killing germs even after washing.  Please do not use if you have an allergy to CHG or antibacterial soaps. If your skin becomes reddened/irritated stop using the CHG.  Do not shave (including legs and  underarms) for at least 48 hours prior to first CHG shower. It is OK to shave your face.  Please follow these instructions carefully.   1. Shower the NIGHT BEFORE SURGERY and the MORNING OF SURGERY with CHG.   2. If you chose to wash your hair, wash your hair first as usual with your normal shampoo.  3. After you shampoo, rinse your hair and body thoroughly to remove the shampoo.  4. Use CHG as you would any other liquid soap. You can apply CHG directly to the skin and wash gently with a scrungie or a clean washcloth.   5. Apply the CHG Soap to your body ONLY FROM THE NECK DOWN.  Do not use on open wounds or open sores. Avoid contact with your eyes, ears, mouth and genitals (private parts). Wash genitals (private parts) with your normal soap.  6. Wash thoroughly, paying special attention to the area where your surgery will be performed.  7. Thoroughly rinse your body with warm water from the neck down.  8. DO NOT shower/wash with your normal soap after using and rinsing off the CHG Soap.  9. Pat yourself dry with a CLEAN TOWEL.   10. Wear CLEAN PAJAMAS   11. Place CLEAN SHEETS on your bed the night of your first shower and DO NOT SLEEP WITH PETS.    Day of Surgery: Do not apply any deodorants/lotions. Please wear clean clothes to the hospital/surgery center.      Please read over the following fact sheets that you were given.

## 2016-06-13 ENCOUNTER — Encounter (HOSPITAL_COMMUNITY): Payer: Self-pay

## 2016-06-13 ENCOUNTER — Encounter (HOSPITAL_COMMUNITY)
Admission: RE | Admit: 2016-06-13 | Discharge: 2016-06-13 | Disposition: A | Discharge status: Home / Self Care | Payer: Medicare Other | Source: Ambulatory Visit | Attending: Orthopedic Surgery | Admitting: Orthopedic Surgery

## 2016-06-13 DIAGNOSIS — Z01812 Encounter for preprocedural laboratory examination: Secondary | ICD-10-CM | POA: Diagnosis not present

## 2016-06-13 DIAGNOSIS — M12811 Other specific arthropathies, not elsewhere classified, right shoulder: Secondary | ICD-10-CM | POA: Diagnosis not present

## 2016-06-13 HISTORY — DX: Major depressive disorder, single episode, unspecified: F32.9

## 2016-06-13 HISTORY — DX: Essential (primary) hypertension: I10

## 2016-06-13 HISTORY — DX: Anemia, unspecified: D64.9

## 2016-06-13 HISTORY — DX: Unspecified osteoarthritis, unspecified site: M19.90

## 2016-06-13 HISTORY — DX: Depression, unspecified: F32.A

## 2016-06-13 LAB — CBC
HCT: 34.7 % — ABNORMAL LOW (ref 36.0–46.0)
HEMOGLOBIN: 11.1 g/dL — AB (ref 12.0–15.0)
MCH: 31.2 pg (ref 26.0–34.0)
MCHC: 32 g/dL (ref 30.0–36.0)
MCV: 97.5 fL (ref 78.0–100.0)
PLATELETS: 274 10*3/uL (ref 150–400)
RBC: 3.56 MIL/uL — AB (ref 3.87–5.11)
RDW: 14.6 % (ref 11.5–15.5)
WBC: 7.3 10*3/uL (ref 4.0–10.5)

## 2016-06-13 LAB — BASIC METABOLIC PANEL
ANION GAP: 9 (ref 5–15)
BUN: 16 mg/dL (ref 6–20)
CALCIUM: 9.8 mg/dL (ref 8.9–10.3)
CO2: 26 mmol/L (ref 22–32)
CREATININE: 0.98 mg/dL (ref 0.44–1.00)
Chloride: 104 mmol/L (ref 101–111)
GFR calc non Af Amer: 57 mL/min — ABNORMAL LOW (ref >60)
Glucose, Bld: 173 mg/dL — ABNORMAL HIGH (ref 65–99)
Potassium: 3.9 mmol/L (ref 3.5–5.1)
SODIUM: 139 mmol/L (ref 135–145)

## 2016-06-13 LAB — GLUCOSE, CAPILLARY: GLUCOSE-CAPILLARY: 147 mg/dL — AB (ref 65–99)

## 2016-06-13 LAB — SURGICAL PCR SCREEN
MRSA, PCR: NEGATIVE
STAPHYLOCOCCUS AUREUS: NEGATIVE

## 2016-06-13 NOTE — Progress Notes (Signed)
PCP: Dr. Darcus Austin Cardiologist: Dr. Marlou Porch Endocrinologist: Dr. Letitia Neri , saw him for a second opinion,  Fasting sugars 100-134 -states she had a cortisone shot in Jan. And it made sugars go up.

## 2016-06-14 LAB — HEMOGLOBIN A1C
HEMOGLOBIN A1C: 8.3 % — AB (ref 4.8–5.6)
Mean Plasma Glucose: 192

## 2016-06-17 NOTE — Progress Notes (Signed)
Anesthesia Chart Review:  Pt is a 71 year old female scheduled for R reverse shoulder arthroplasty on 06/21/2016 with Netta Cedars, M.D.  PCP is Darcus Austin, MD Cardiologist is Candee Furbish, MD.  Dr. Marlou Porch cleared pt for surgery in comment on cardiac MRI   PMH includes: HTN, DM, anemia, rest cancer. Current smoker. BMI 17.5  Medications include: ASA 81 mg, Lipitor, iron, lisinopril-HCTZ, metformin, potassium.  Preoperative labs reviewed.  HbA1c 8.3, glucose 173.    EKG 03/01/16: sinus rhythm, T-wave inversion diffusely with highest amplitude being in V4. Heart rate 90 bpm. Concern for an apical variant hypertrophic cardiomyopathy pattern.  Cardiac CT 03/12/16:  1. Normal left ventricular size, thickness and systolic function (LVEF = 61%) with no regional wall motion abnormalities. No evidence of late gadolinium enhancement in the left ventricular myocardium. 2. Normal right ventricular size, thickness and systolic function (LVEF = 55%) with no regional wall motion abnormalities. 3. Normal biatrial size. 4. Trivial mitral and tricuspid regurgitation. - This study is of limited quality as the patient was unable to hold breath and there is associated motion, however there is no evidence for hypertrophic cardiomyopathy.  If no changes, I anticipate pt can proceed with surgery as scheduled.   Willeen Cass, FNP-BC [facility] Short Stay Surgical Center/Anesthesiology Phone: [phone] 06/17/2016 3:13 PM

## 2016-06-20 NOTE — Anesthesia Preprocedure Evaluation (Addendum)
Anesthesia Evaluation  Patient identified by MRN, date of birth, ID band Patient awake    Reviewed: Allergy & Precautions, H&P , NPO status , Patient's Chart, lab work & pertinent test results  Airway Mallampati: II  TM Distance: >3 FB Neck ROM: Full    Dental no notable dental hx. (+) Partial Upper, Dental Advisory Given   Pulmonary Current Smoker,    Pulmonary exam normal breath sounds clear to auscultation       Cardiovascular Exercise Tolerance: Good hypertension, Pt. on medications  Rhythm:Regular Rate:Normal     Neuro/Psych Depression negative neurological ROS     GI/Hepatic negative GI ROS, Neg liver ROS,   Endo/Other  diabetes, Type 2, Oral Hypoglycemic Agents  Renal/GU negative Renal ROS  negative genitourinary   Musculoskeletal  (+) Arthritis , Osteoarthritis,    Abdominal   Peds  Hematology negative hematology ROS (+) anemia ,   Anesthesia Other Findings   Reproductive/Obstetrics negative OB ROS                           Anesthesia Physical Anesthesia Plan  ASA: II  Anesthesia Plan: General   Post-op Pain Management:  Regional for Post-op pain   Induction: Intravenous  Airway Management Planned: Oral ETT  Additional Equipment:   Intra-op Plan:   Post-operative Plan: Extubation in OR  Informed Consent: I have reviewed the patients History and Physical, chart, labs and discussed the procedure including the risks, benefits and alternatives for the proposed anesthesia with the patient or authorized representative who has indicated his/her understanding and acceptance.   Dental advisory given  Plan Discussed with: CRNA  Anesthesia Plan Comments:         Anesthesia Quick Evaluation

## 2016-06-21 ENCOUNTER — Encounter (HOSPITAL_COMMUNITY): Payer: Self-pay | Admitting: *Deleted

## 2016-06-21 ENCOUNTER — Inpatient Hospital Stay (HOSPITAL_COMMUNITY): Payer: Medicare Other

## 2016-06-21 ENCOUNTER — Encounter (HOSPITAL_COMMUNITY)
Admission: RE | Disposition: A | Discharge status: Home / Self Care | Payer: Self-pay | Source: Ambulatory Visit | Attending: Orthopedic Surgery

## 2016-06-21 ENCOUNTER — Inpatient Hospital Stay (HOSPITAL_COMMUNITY)
Admission: RE | Admit: 2016-06-21 | Discharge: 2016-06-22 | DRG: 483 | Disposition: A | Discharge status: Home / Self Care | Payer: Medicare Other | Source: Ambulatory Visit | Attending: Orthopedic Surgery | Admitting: Orthopedic Surgery

## 2016-06-21 ENCOUNTER — Inpatient Hospital Stay (HOSPITAL_COMMUNITY): Payer: Medicare Other | Admitting: Emergency Medicine

## 2016-06-21 ENCOUNTER — Inpatient Hospital Stay (HOSPITAL_COMMUNITY): Payer: Medicare Other | Admitting: Anesthesiology

## 2016-06-21 DIAGNOSIS — M19011 Primary osteoarthritis, right shoulder: Principal | ICD-10-CM | POA: Diagnosis present

## 2016-06-21 DIAGNOSIS — Z7982 Long term (current) use of aspirin: Secondary | ICD-10-CM

## 2016-06-21 DIAGNOSIS — Z96611 Presence of right artificial shoulder joint: Secondary | ICD-10-CM | POA: Diagnosis not present

## 2016-06-21 DIAGNOSIS — Z853 Personal history of malignant neoplasm of breast: Secondary | ICD-10-CM | POA: Diagnosis not present

## 2016-06-21 DIAGNOSIS — Z880 Allergy status to penicillin: Secondary | ICD-10-CM

## 2016-06-21 DIAGNOSIS — Z79899 Other long term (current) drug therapy: Secondary | ICD-10-CM

## 2016-06-21 DIAGNOSIS — F329 Major depressive disorder, single episode, unspecified: Secondary | ICD-10-CM | POA: Diagnosis present

## 2016-06-21 DIAGNOSIS — E119 Type 2 diabetes mellitus without complications: Secondary | ICD-10-CM | POA: Diagnosis not present

## 2016-06-21 DIAGNOSIS — D649 Anemia, unspecified: Secondary | ICD-10-CM | POA: Diagnosis not present

## 2016-06-21 DIAGNOSIS — Z471 Aftercare following joint replacement surgery: Secondary | ICD-10-CM | POA: Diagnosis not present

## 2016-06-21 DIAGNOSIS — F17210 Nicotine dependence, cigarettes, uncomplicated: Secondary | ICD-10-CM | POA: Diagnosis present

## 2016-06-21 DIAGNOSIS — Z7984 Long term (current) use of oral hypoglycemic drugs: Secondary | ICD-10-CM | POA: Diagnosis not present

## 2016-06-21 DIAGNOSIS — M25511 Pain in right shoulder: Secondary | ICD-10-CM | POA: Diagnosis not present

## 2016-06-21 DIAGNOSIS — I10 Essential (primary) hypertension: Secondary | ICD-10-CM | POA: Diagnosis not present

## 2016-06-21 DIAGNOSIS — M75101 Unspecified rotator cuff tear or rupture of right shoulder, not specified as traumatic: Secondary | ICD-10-CM | POA: Diagnosis present

## 2016-06-21 DIAGNOSIS — G8918 Other acute postprocedural pain: Secondary | ICD-10-CM | POA: Diagnosis not present

## 2016-06-21 HISTORY — PX: REVERSE SHOULDER ARTHROPLASTY: SHX5054

## 2016-06-21 LAB — GLUCOSE, CAPILLARY
GLUCOSE-CAPILLARY: 154 mg/dL — AB (ref 65–99)
GLUCOSE-CAPILLARY: 121 mg/dL — AB (ref 65–99)
GLUCOSE-CAPILLARY: 92 mg/dL (ref 65–99)
Glucose-Capillary: 156 mg/dL — ABNORMAL HIGH (ref 65–99)
Glucose-Capillary: 226 mg/dL — ABNORMAL HIGH (ref 65–99)

## 2016-06-21 SURGERY — ARTHROPLASTY, SHOULDER, TOTAL, REVERSE
Anesthesia: General | Site: Shoulder | Laterality: Right

## 2016-06-21 MED ORDER — METOCLOPRAMIDE HCL 5 MG PO TABS: 5.0000 mg | ORAL_TABLET | Freq: Three times a day (TID) | ORAL | Status: DC | PRN

## 2016-06-21 MED ORDER — LISINOPRIL-HYDROCHLOROTHIAZIDE 20-25 MG PO TABS: 0.5000 | ORAL_TABLET | Freq: Every day | ORAL | Status: DC

## 2016-06-21 MED ORDER — INSULIN ASPART 100 UNIT/ML ~~LOC~~ SOLN
0.0000 [IU] | Freq: Every day | SUBCUTANEOUS | Status: DC
  Administered 2016-06-21: 2 [IU] via SUBCUTANEOUS

## 2016-06-21 MED ORDER — LACTATED RINGERS IV SOLN
INTRAVENOUS | Status: DC | PRN
  Administered 2016-06-21: 07:00:00 via INTRAVENOUS

## 2016-06-21 MED ORDER — BUPIVACAINE HCL (PF) 0.25 % IJ SOLN
INTRAMUSCULAR | Status: AC
  Filled 2016-06-21: qty 30

## 2016-06-21 MED ORDER — METHOCARBAMOL 1000 MG/10ML IJ SOLN
500.0000 mg | Freq: Four times a day (QID) | INTRAMUSCULAR | Status: DC | PRN
  Filled 2016-06-21: qty 5

## 2016-06-21 MED ORDER — ONDANSETRON HCL 4 MG/2ML IJ SOLN: 4.0000 mg | Freq: Four times a day (QID) | INTRAMUSCULAR | Status: DC | PRN

## 2016-06-21 MED ORDER — LIDOCAINE 2% (20 MG/ML) 5 ML SYRINGE
INTRAMUSCULAR | Status: AC
  Filled 2016-06-21: qty 5

## 2016-06-21 MED ORDER — EPHEDRINE SULFATE-NACL 50-0.9 MG/10ML-% IV SOSY
PREFILLED_SYRINGE | INTRAVENOUS | Status: DC | PRN
  Administered 2016-06-21 (×3): 5 mg via INTRAVENOUS

## 2016-06-21 MED ORDER — PROPOFOL 10 MG/ML IV BOLUS
INTRAVENOUS | Status: AC
  Filled 2016-06-21: qty 20

## 2016-06-21 MED ORDER — METHOCARBAMOL 500 MG PO TABS: 500.0000 mg | ORAL_TABLET | Freq: Three times a day (TID) | ORAL | 1 refills | Status: DC | PRN

## 2016-06-21 MED ORDER — ACETAMINOPHEN 325 MG PO TABS
650.0000 mg | ORAL_TABLET | Freq: Four times a day (QID) | ORAL | Status: DC | PRN
  Administered 2016-06-22: 650 mg via ORAL
  Filled 2016-06-21: qty 2

## 2016-06-21 MED ORDER — POLYETHYLENE GLYCOL 3350 17 G PO PACK: 17.0000 g | PACK | Freq: Every day | ORAL | Status: DC | PRN

## 2016-06-21 MED ORDER — HYDROCHLOROTHIAZIDE 12.5 MG PO CAPS
12.5000 mg | ORAL_CAPSULE | Freq: Every day | ORAL | Status: DC
  Administered 2016-06-21: 12.5 mg via ORAL
  Filled 2016-06-21: qty 1

## 2016-06-21 MED ORDER — POTASSIUM CHLORIDE CRYS ER 10 MEQ PO TBCR
10.0000 meq | EXTENDED_RELEASE_TABLET | Freq: Every day | ORAL | Status: DC
  Administered 2016-06-21: 10 meq via ORAL
  Filled 2016-06-21 (×4): qty 1

## 2016-06-21 MED ORDER — ONDANSETRON HCL 4 MG PO TABS: 4.0000 mg | ORAL_TABLET | Freq: Four times a day (QID) | ORAL | Status: DC | PRN

## 2016-06-21 MED ORDER — ROCURONIUM BROMIDE 100 MG/10ML IV SOLN
INTRAVENOUS | Status: DC | PRN
  Administered 2016-06-21: 25 mg via INTRAVENOUS

## 2016-06-21 MED ORDER — MORPHINE SULFATE (PF) 2 MG/ML IV SOLN
1.0000 mg | INTRAVENOUS | Status: DC | PRN
  Administered 2016-06-21: 2 mg via INTRAVENOUS
  Administered 2016-06-21: 1 mg via INTRAVENOUS
  Filled 2016-06-21 (×2): qty 1

## 2016-06-21 MED ORDER — DOCUSATE SODIUM 100 MG PO CAPS
100.0000 mg | ORAL_CAPSULE | Freq: Two times a day (BID) | ORAL | Status: DC
  Administered 2016-06-21 – 2016-06-22 (×4): 100 mg via ORAL
  Filled 2016-06-21 (×3): qty 1

## 2016-06-21 MED ORDER — ATORVASTATIN CALCIUM 40 MG PO TABS
40.0000 mg | ORAL_TABLET | Freq: Every day | ORAL | Status: DC
  Administered 2016-06-21: 40 mg via ORAL
  Filled 2016-06-21: qty 1

## 2016-06-21 MED ORDER — PHENYLEPHRINE HCL 10 MG/ML IJ SOLN
INTRAVENOUS | Status: DC | PRN
  Administered 2016-06-21: 25 ug/min via INTRAVENOUS

## 2016-06-21 MED ORDER — EPINEPHRINE PF 1 MG/ML IJ SOLN
INTRAMUSCULAR | Status: AC
  Filled 2016-06-21: qty 1

## 2016-06-21 MED ORDER — SUGAMMADEX SODIUM 200 MG/2ML IV SOLN
INTRAVENOUS | Status: DC | PRN
  Administered 2016-06-21: 200 mg via INTRAVENOUS

## 2016-06-21 MED ORDER — HYDROCODONE-ACETAMINOPHEN 5-325 MG PO TABS: 0.5000 | ORAL_TABLET | ORAL | 0 refills | Status: DC | PRN

## 2016-06-21 MED ORDER — MIDAZOLAM HCL 2 MG/2ML IJ SOLN
INTRAMUSCULAR | Status: AC
  Filled 2016-06-21: qty 2

## 2016-06-21 MED ORDER — HYDROCODONE-ACETAMINOPHEN 5-325 MG PO TABS
0.5000 | ORAL_TABLET | ORAL | Status: DC | PRN
  Administered 2016-06-22: 1 via ORAL
  Filled 2016-06-21: qty 1

## 2016-06-21 MED ORDER — PROPOFOL 10 MG/ML IV BOLUS
INTRAVENOUS | Status: DC | PRN
  Administered 2016-06-21: 90 mg via INTRAVENOUS

## 2016-06-21 MED ORDER — METHOCARBAMOL 500 MG PO TABS
500.0000 mg | ORAL_TABLET | Freq: Four times a day (QID) | ORAL | Status: DC | PRN
  Administered 2016-06-22: 500 mg via ORAL
  Filled 2016-06-21: qty 1

## 2016-06-21 MED ORDER — CHLORHEXIDINE GLUCONATE 4 % EX LIQD: 60.0000 mL | Freq: Once | CUTANEOUS | Status: DC

## 2016-06-21 MED ORDER — AMITRIPTYLINE HCL 50 MG PO TABS: 75.0000 mg | ORAL_TABLET | Freq: Every evening | ORAL | Status: DC | PRN

## 2016-06-21 MED ORDER — BUPIVACAINE-EPINEPHRINE (PF) 0.5% -1:200000 IJ SOLN
INTRAMUSCULAR | Status: DC | PRN
  Administered 2016-06-21: 20 mL via PERINEURAL

## 2016-06-21 MED ORDER — SUGAMMADEX SODIUM 200 MG/2ML IV SOLN
INTRAVENOUS | Status: AC
  Filled 2016-06-21: qty 2

## 2016-06-21 MED ORDER — LIDOCAINE HCL (CARDIAC) 20 MG/ML IV SOLN
INTRAVENOUS | Status: DC | PRN
Start: 2016-06-21 — End: 2016-06-21
  Administered 2016-06-21: 40 mg via INTRAVENOUS

## 2016-06-21 MED ORDER — LISINOPRIL 10 MG PO TABS
10.0000 mg | ORAL_TABLET | Freq: Every day | ORAL | Status: DC
  Filled 2016-06-21: qty 1

## 2016-06-21 MED ORDER — FENTANYL CITRATE (PF) 100 MCG/2ML IJ SOLN
INTRAMUSCULAR | Status: DC | PRN
  Administered 2016-06-21: 50 ug via INTRAVENOUS

## 2016-06-21 MED ORDER — MIDAZOLAM HCL 5 MG/5ML IJ SOLN
INTRAMUSCULAR | Status: DC | PRN
  Administered 2016-06-21: 1 mg via INTRAVENOUS

## 2016-06-21 MED ORDER — INSULIN ASPART 100 UNIT/ML ~~LOC~~ SOLN
0.0000 [IU] | Freq: Three times a day (TID) | SUBCUTANEOUS | Status: DC
  Administered 2016-06-21: 1 [IU] via SUBCUTANEOUS
  Administered 2016-06-22: 3 [IU] via SUBCUTANEOUS

## 2016-06-21 MED ORDER — MENTHOL 3 MG MT LOZG: 1.0000 | LOZENGE | OROMUCOSAL | Status: DC | PRN

## 2016-06-21 MED ORDER — ONDANSETRON HCL 4 MG/2ML IJ SOLN
INTRAMUSCULAR | Status: AC
  Filled 2016-06-21: qty 2

## 2016-06-21 MED ORDER — 0.9 % SODIUM CHLORIDE (POUR BTL) OPTIME
TOPICAL | Status: DC | PRN
  Administered 2016-06-21: 1000 mL

## 2016-06-21 MED ORDER — SODIUM CHLORIDE 0.9 % IV SOLN
INTRAVENOUS | Status: DC
  Administered 2016-06-22: 07:00:00 via INTRAVENOUS

## 2016-06-21 MED ORDER — FENTANYL CITRATE (PF) 100 MCG/2ML IJ SOLN: 25.0000 ug | INTRAMUSCULAR | Status: DC | PRN

## 2016-06-21 MED ORDER — ACETAMINOPHEN 650 MG RE SUPP: 650.0000 mg | Freq: Four times a day (QID) | RECTAL | Status: DC | PRN

## 2016-06-21 MED ORDER — CLINDAMYCIN PHOSPHATE 600 MG/50ML IV SOLN
600.0000 mg | Freq: Four times a day (QID) | INTRAVENOUS | Status: AC
  Administered 2016-06-21 – 2016-06-22 (×3): 600 mg via INTRAVENOUS
  Filled 2016-06-21 (×3): qty 50

## 2016-06-21 MED ORDER — ASPIRIN EC 81 MG PO TBEC
81.0000 mg | DELAYED_RELEASE_TABLET | Freq: Every day | ORAL | Status: DC
  Administered 2016-06-21: 81 mg via ORAL
  Filled 2016-06-21: qty 1

## 2016-06-21 MED ORDER — METOCLOPRAMIDE HCL 5 MG/ML IJ SOLN: 5.0000 mg | Freq: Three times a day (TID) | INTRAMUSCULAR | Status: DC | PRN

## 2016-06-21 MED ORDER — ALPRAZOLAM 0.25 MG PO TABS: 0.2500 mg | ORAL_TABLET | Freq: Three times a day (TID) | ORAL | Status: DC | PRN

## 2016-06-21 MED ORDER — BUPIVACAINE-EPINEPHRINE 0.25% -1:200000 IJ SOLN
INTRAMUSCULAR | Status: DC | PRN
  Administered 2016-06-21: 6 mL

## 2016-06-21 MED ORDER — ROCURONIUM BROMIDE 50 MG/5ML IV SOSY
PREFILLED_SYRINGE | INTRAVENOUS | Status: AC
  Filled 2016-06-21: qty 5

## 2016-06-21 MED ORDER — FERROUS SULFATE 325 (65 FE) MG PO TABS
325.0000 mg | ORAL_TABLET | ORAL | Status: DC
  Administered 2016-06-21: 325 mg via ORAL
  Filled 2016-06-21 (×2): qty 1

## 2016-06-21 MED ORDER — HYOSCYAMINE SULFATE 0.125 MG PO TBDP
0.1250 mg | ORAL_TABLET | Freq: Four times a day (QID) | ORAL | Status: DC | PRN
  Filled 2016-06-21: qty 1

## 2016-06-21 MED ORDER — PHENOL 1.4 % MT LIQD: 1.0000 | OROMUCOSAL | Status: DC | PRN

## 2016-06-21 MED ORDER — EPHEDRINE 5 MG/ML INJ
INTRAVENOUS | Status: AC
  Filled 2016-06-21: qty 10

## 2016-06-21 MED ORDER — INSULIN ASPART 100 UNIT/ML ~~LOC~~ SOLN
3.0000 [IU] | Freq: Three times a day (TID) | SUBCUTANEOUS | Status: DC
  Administered 2016-06-21 – 2016-06-22 (×2): 3 [IU] via SUBCUTANEOUS

## 2016-06-21 MED ORDER — ADULT MULTIVITAMIN W/MINERALS CH
1.0000 | ORAL_TABLET | Freq: Every day | ORAL | Status: DC
  Administered 2016-06-21 – 2016-06-22 (×2): 1 via ORAL
  Filled 2016-06-21 (×2): qty 1

## 2016-06-21 MED ORDER — METFORMIN HCL ER 500 MG PO TB24
1000.0000 mg | ORAL_TABLET | Freq: Two times a day (BID) | ORAL | Status: DC
  Administered 2016-06-22: 1000 mg via ORAL
  Filled 2016-06-21: qty 2

## 2016-06-21 MED ORDER — FENTANYL CITRATE (PF) 100 MCG/2ML IJ SOLN
INTRAMUSCULAR | Status: AC
  Filled 2016-06-21: qty 2

## 2016-06-21 MED ORDER — CLINDAMYCIN PHOSPHATE 900 MG/50ML IV SOLN
900.0000 mg | INTRAVENOUS | Status: AC
  Administered 2016-06-21: 900 mg via INTRAVENOUS
  Filled 2016-06-21: qty 50

## 2016-06-21 SURGICAL SUPPLY — 80 items
BIT DRILL 170X2.5X (BIT) IMPLANT
BIT DRILL 5/64X5 DISP (BIT) ×1 IMPLANT
BIT DRL 170X2.5X (BIT)
BLADE SAG 18X100X1.27 (BLADE) ×3 IMPLANT
BOWL SMART MIX CTS (DISPOSABLE) ×2 IMPLANT
CAPT SHLDR REVTOTAL 2 ×2 IMPLANT
CEMENT HV SMART SET (Cement) ×2 IMPLANT
CLOSURE STERI-STRIP 1/2X4 (GAUZE/BANDAGES/DRESSINGS) ×1
CLOSURE WOUND 1/2 X4 (GAUZE/BANDAGES/DRESSINGS) ×1
CLSR STERI-STRIP ANTIMIC 1/2X4 (GAUZE/BANDAGES/DRESSINGS) ×1 IMPLANT
COVER SURGICAL LIGHT HANDLE (MISCELLANEOUS) ×3 IMPLANT
DRAPE IMP U-DRAPE 54X76 (DRAPES) ×6 IMPLANT
DRAPE INCISE IOBAN 66X45 STRL (DRAPES) ×3 IMPLANT
DRAPE ORTHO SPLIT 77X108 STRL (DRAPES) ×6
DRAPE SURG ORHT 6 SPLT 77X108 (DRAPES) ×2 IMPLANT
DRAPE U-SHAPE 47X51 STRL (DRAPES) ×3 IMPLANT
DRAPE X-RAY CASS 24X20 (DRAPES) IMPLANT
DRILL 2.5 (BIT)
DRSG ADAPTIC 3X8 NADH LF (GAUZE/BANDAGES/DRESSINGS) ×3 IMPLANT
DRSG PAD ABDOMINAL 8X10 ST (GAUZE/BANDAGES/DRESSINGS) ×3 IMPLANT
DURAPREP 26ML APPLICATOR (WOUND CARE) ×3 IMPLANT
ELECT BLADE 4.0 EZ CLEAN MEGAD (MISCELLANEOUS) ×3
ELECT NDL TIP 2.8 STRL (NEEDLE) ×1 IMPLANT
ELECT NEEDLE TIP 2.8 STRL (NEEDLE) ×3 IMPLANT
ELECT REM PT RETURN 9FT ADLT (ELECTROSURGICAL) ×3
ELECTRODE BLDE 4.0 EZ CLN MEGD (MISCELLANEOUS) ×1 IMPLANT
ELECTRODE REM PT RTRN 9FT ADLT (ELECTROSURGICAL) ×1 IMPLANT
FILTER STRAW FLUID ASPIR (MISCELLANEOUS) ×2 IMPLANT
GAUZE SPONGE 4X4 12PLY STRL (GAUZE/BANDAGES/DRESSINGS) ×3 IMPLANT
GLOVE BIOGEL PI ORTHO PRO 7.5 (GLOVE) ×2
GLOVE BIOGEL PI ORTHO PRO SZ8 (GLOVE) ×2
GLOVE ORTHO TXT STRL SZ7.5 (GLOVE) ×3 IMPLANT
GLOVE PI ORTHO PRO STRL 7.5 (GLOVE) ×1 IMPLANT
GLOVE PI ORTHO PRO STRL SZ8 (GLOVE) ×1 IMPLANT
GLOVE SURG ORTHO 8.5 STRL (GLOVE) ×3 IMPLANT
GOWN STRL REUS W/ TWL LRG LVL3 (GOWN DISPOSABLE) ×1 IMPLANT
GOWN STRL REUS W/ TWL XL LVL3 (GOWN DISPOSABLE) ×2 IMPLANT
GOWN STRL REUS W/TWL LRG LVL3 (GOWN DISPOSABLE) ×3
GOWN STRL REUS W/TWL XL LVL3 (GOWN DISPOSABLE) ×6
HANDPIECE INTERPULSE COAX TIP (DISPOSABLE)
KIT BASIN OR (CUSTOM PROCEDURE TRAY) ×3 IMPLANT
KIT ROOM TURNOVER OR (KITS) ×3 IMPLANT
MANIFOLD NEPTUNE II (INSTRUMENTS) ×3 IMPLANT
NDL 1/2 CIR MAYO (NEEDLE) ×1 IMPLANT
NDL FILTER BLUNT 18X1 1/2 (NEEDLE) IMPLANT
NDL HYPO 25GX1X1/2 BEV (NEEDLE) ×1 IMPLANT
NEEDLE 1/2 CIR MAYO (NEEDLE) IMPLANT
NEEDLE FILTER BLUNT 18X 1/2SAF (NEEDLE) ×2
NEEDLE FILTER BLUNT 18X1 1/2 (NEEDLE) ×1 IMPLANT
NEEDLE HYPO 25GX1X1/2 BEV (NEEDLE) ×3 IMPLANT
NS IRRIG 1000ML POUR BTL (IV SOLUTION) ×3 IMPLANT
PACK SHOULDER (CUSTOM PROCEDURE TRAY) ×3 IMPLANT
PAD ARMBOARD 7.5X6 YLW CONV (MISCELLANEOUS) ×6 IMPLANT
PIN GUIDE 1.2 (PIN) IMPLANT
PIN GUIDE GLENOPHERE 1.5MX300M (PIN) IMPLANT
PIN METAGLENE 2.5 (PIN) IMPLANT
SET HNDPC FAN SPRY TIP SCT (DISPOSABLE) IMPLANT
SLING ARM LRG ADULT FOAM STRAP (SOFTGOODS) IMPLANT
SLING ARM MED ADULT FOAM STRAP (SOFTGOODS) ×2 IMPLANT
SPONGE LAP 18X18 X RAY DECT (DISPOSABLE) IMPLANT
SPONGE LAP 4X18 X RAY DECT (DISPOSABLE) ×3 IMPLANT
STRIP CLOSURE SKIN 1/2X4 (GAUZE/BANDAGES/DRESSINGS) ×2 IMPLANT
SUCTION FRAZIER HANDLE 10FR (MISCELLANEOUS) ×2
SUCTION TUBE FRAZIER 10FR DISP (MISCELLANEOUS) ×1 IMPLANT
SUT FIBERWIRE #2 38 T-5 BLUE (SUTURE) ×6
SUT MNCRL AB 4-0 PS2 18 (SUTURE) ×3 IMPLANT
SUT VIC AB 0 CT2 27 (SUTURE) ×3 IMPLANT
SUT VIC AB 2-0 CT1 27 (SUTURE) ×3
SUT VIC AB 2-0 CT1 TAPERPNT 27 (SUTURE) ×1 IMPLANT
SUT VICRYL 0 CT 1 36IN (SUTURE) ×3 IMPLANT
SUTURE FIBERWR #2 38 T-5 BLUE (SUTURE) ×2 IMPLANT
SYR CONTROL 10ML LL (SYRINGE) ×3 IMPLANT
SYR TB 1ML LUER SLIP (SYRINGE) ×2 IMPLANT
TAPE CLOTH SURG 6X10 WHT LF (GAUZE/BANDAGES/DRESSINGS) ×2 IMPLANT
TOWEL OR 17X24 6PK STRL BLUE (TOWEL DISPOSABLE) ×3 IMPLANT
TOWEL OR 17X26 10 PK STRL BLUE (TOWEL DISPOSABLE) ×3 IMPLANT
TOWER CARTRIDGE SMART MIX (DISPOSABLE) IMPLANT
TRAY FOLEY W/METER SILVER 16FR (SET/KITS/TRAYS/PACK) IMPLANT
WATER STERILE IRR 1000ML POUR (IV SOLUTION) ×3 IMPLANT
YANKAUER SUCT BULB TIP NO VENT (SUCTIONS) ×3 IMPLANT

## 2016-06-21 NOTE — Transfer of Care (Signed)
Immediate Anesthesia Transfer of Care Note  Patient: Sara Weeks  Procedure(s) Performed: Procedure(s): RIGHT SHOULDER REVERSE SHOULDER ARTHROPLASTY (Right)  Patient Location: PACU  Anesthesia Type:GA combined with regional for post-op pain  Level of Consciousness: awake, alert  and oriented  Airway & Oxygen Therapy: Patient Spontanous Breathing and Patient connected to face mask oxygen  Post-op Assessment: Report given to RN, Post -op Vital signs reviewed and stable and Patient moving all extremities  Post vital signs: Reviewed and stable  Last Vitals:  Vitals:   06/21/16 0605  BP: 103/64  Pulse: 85  Resp: 18  Temp: 37 C    Last Pain:  Vitals:   06/21/16 0605  TempSrc: Oral  PainSc:          Complications: No apparent anesthesia complications

## 2016-06-21 NOTE — Anesthesia Procedure Notes (Signed)
Procedure Name: Intubation Date/Time: 06/21/2016 7:32 AM Performed by: Kyung Rudd Pre-anesthesia Checklist: Patient identified, Emergency Drugs available, Suction available and Patient being monitored Patient Re-evaluated:Patient Re-evaluated prior to inductionOxygen Delivery Method: Circle system utilized Preoxygenation: Pre-oxygenation with 100% oxygen Intubation Type: IV induction Ventilation: Mask ventilation without difficulty Laryngoscope Size: Mac and 3 Grade View: Grade I Tube type: Oral Tube size: 7.0 mm Number of attempts: 1 Airway Equipment and Method: Stylet Placement Confirmation: ETT inserted through vocal cords under direct vision,  positive ETCO2 and breath sounds checked- equal and bilateral Secured at: 20 cm Tube secured with: Tape Dental Injury: Teeth and Oropharynx as per pre-operative assessment

## 2016-06-21 NOTE — Anesthesia Postprocedure Evaluation (Signed)
Anesthesia Post Note  Patient: Sara Weeks  Procedure(s) Performed: Procedure(s) (LRB): RIGHT SHOULDER REVERSE SHOULDER ARTHROPLASTY (Right)  Patient location during evaluation: PACU Anesthesia Type: General and Regional Level of consciousness: awake and alert Pain management: pain level controlled Vital Signs Assessment: post-procedure vital signs reviewed and stable Respiratory status: spontaneous breathing, nonlabored ventilation, respiratory function stable and patient connected to nasal cannula oxygen Cardiovascular status: blood pressure returned to baseline and stable Postop Assessment: no signs of nausea or vomiting Anesthetic complications: no       Last Vitals:  Vitals:   06/21/16 1051 06/21/16 1107  BP: (!) 149/75 (!) 148/72  Pulse: 68 67  Resp: 14 14  Temp:      Last Pain:  Vitals:   06/21/16 1105  TempSrc:   PainSc: 0-No pain                 Maxamus Colao,W. EDMOND

## 2016-06-21 NOTE — Anesthesia Procedure Notes (Signed)
Anesthesia Regional Block: Interscalene brachial plexus block   Pre-Anesthetic Checklist: ,, timeout performed, Correct Patient, Correct Site, Correct Laterality, Correct Procedure, Correct Position, site marked, Risks and benefits discussed, pre-op evaluation,  At surgeon's request and post-op pain management  Laterality: Right  Prep: Maximum Sterile Barrier Precautions used, Betadine, chloraprep       Needles:  Injection technique: Single-shot  Needle Type: Other   (Arrow 90mm)   Needle Length: 5cm  Needle Gauge: 22     Additional Needles:   Procedures: ultrasound guided, nerve stimulator,,,,,,   Nerve Stimulator or Paresthesia:  Response: Posterior cord,  Response: Lateral cord,   Additional Responses:   Narrative:  Start time: 06/21/2016 7:00 AM End time: 06/21/2016 7:09 AM Injection made incrementally with aspirations every 5 mL. Anesthesiologist: Roderic Palau  Additional Notes: 2% Lidocaine skin wheel.

## 2016-06-21 NOTE — Discharge Instructions (Signed)
Ice to the shoulder as much as possible.  Keep the incision clean and dry and covered for 5 days then ok to get it wet in the shower.  May remove the sling and use the right arm for light activities.  Do not reach behind your back or push out of a chair.  Prop a pillow behind the elbow to keep the arm across your body while resting.  Follow up in two weeks in the office [phone], call for appt

## 2016-06-21 NOTE — Interval H&P Note (Signed)
History and Physical Interval Note:  06/21/2016 7:22 AM  Sara Weeks  has presented today for surgery, with the diagnosis of Right shoulder rotator cuff tear, osteoarthritis  The various methods of treatment have been discussed with the patient and family. After consideration of risks, benefits and other options for treatment, the patient has consented to  Procedure(s): RIGHT SHOULDER REVERSE SHOULDER ARTHROPLASTY (Right) as a surgical intervention .  The patient's history has been reviewed, patient examined, no change in status, stable for surgery.  I have reviewed the patient's chart and labs.  Questions were answered to the patient's satisfaction.     Monroe Toure,STEVEN R

## 2016-06-21 NOTE — Brief Op Note (Signed)
06/21/2016  9:18 AM  PATIENT:  Sara Weeks  71 y.o. female  PRE-OPERATIVE DIAGNOSIS:  Right shoulder rotator cuff tear, osteoarthritis  POST-OPERATIVE DIAGNOSIS:  Right shoulder rotator cuff tear, osteoarthritis  PROCEDURE:  Procedure(s): RIGHT SHOULDER REVERSE SHOULDER ARTHROPLASTY (Right) DePuy Delta Xtend  SURGEON:  Surgeon(s) and Role:    * Netta Cedars, MD - Primary  PHYSICIAN ASSISTANT:   ASSISTANTS: Ventura Bruns, PA-C   ANESTHESIA:   regional and general  EBL:  Total I/O In: 700 [I.V.:700] Out: 100 [Blood:100]  BLOOD ADMINISTERED:none  DRAINS: none   LOCAL MEDICATIONS USED:  MARCAINE     SPECIMEN:  No Specimen  DISPOSITION OF SPECIMEN:  N/A  COUNTS:  YES  TOURNIQUET:  * No tourniquets in log *  DICTATION: .Other Dictation: Dictation Number [id number]  PLAN OF CARE: Admit to inpatient   PATIENT DISPOSITION:  PACU - hemodynamically stable.   Delay start of Pharmacological VTE agent (>24hrs) due to surgical blood loss or risk of bleeding: not applicable

## 2016-06-22 LAB — BASIC METABOLIC PANEL
Anion gap: 12 (ref 5–15)
BUN: 18 mg/dL (ref 6–20)
CHLORIDE: 99 mmol/L — AB (ref 101–111)
CO2: 24 mmol/L (ref 22–32)
Calcium: 9.6 mg/dL (ref 8.9–10.3)
Creatinine, Ser: 1.1 mg/dL — ABNORMAL HIGH (ref 0.44–1.00)
GFR calc non Af Amer: 50 mL/min — ABNORMAL LOW (ref >60)
GFR, EST AFRICAN AMERICAN: 58 mL/min — AB (ref >60)
Glucose, Bld: 195 mg/dL — ABNORMAL HIGH (ref 65–99)
POTASSIUM: 3.7 mmol/L (ref 3.5–5.1)
SODIUM: 135 mmol/L (ref 135–145)

## 2016-06-22 LAB — HEMOGLOBIN AND HEMATOCRIT, BLOOD
HCT: 31.1 % — ABNORMAL LOW (ref 36.0–46.0)
Hemoglobin: 10.1 g/dL — ABNORMAL LOW (ref 12.0–15.0)

## 2016-06-22 LAB — GLUCOSE, CAPILLARY: GLUCOSE-CAPILLARY: 182 mg/dL — AB (ref 65–99)

## 2016-06-22 MED ORDER — HYDROCODONE-ACETAMINOPHEN 5-325 MG PO TABS
1.0000 | ORAL_TABLET | ORAL | Status: DC | PRN
  Administered 2016-06-22 (×2): 1 via ORAL
  Filled 2016-06-22 (×2): qty 1

## 2016-06-22 NOTE — Discharge Summary (Signed)
Patient ID: Sara Weeks MRN: [id number] DOB/AGE: Sep 03, 1945 71 y.o.  Admit date: 06/21/2016 Discharge date: 06/22/2016  Admission Diagnoses:  Active Problems:   S/P shoulder replacement, right   Discharge Diagnoses:  Same  Past Medical History:  Diagnosis Date  . Anemia   . Arthritis   . Depression   . Diabetes mellitus   . Hx breast cancer, DCIS, L 08/17/2001  . Hypertension     Surgeries: Procedure(s): RIGHT SHOULDER REVERSE SHOULDER ARTHROPLASTY on 06/21/2016   Consultants:   Discharged Condition: Improved  Hospital Course: Sara Weeks is an 71 y.o. female who was admitted 06/21/2016 for operative treatment of<principal problem not specified>. Patient has severe unremitting pain that affects sleep, daily activities, and work/hobbies. After pre-op clearance the patient was taken to the operating room on 06/21/2016 and underwent  Procedure(s): RIGHT SHOULDER REVERSE SHOULDER ARTHROPLASTY.    Patient was given perioperative antibiotics: Anti-infectives    Start     Dose/Rate Route Frequency Ordered Stop   06/21/16 1330  clindamycin (CLEOCIN) IVPB 600 mg     600 mg 100 mL/hr over 30 Minutes Intravenous Every 6 hours 06/21/16 1144 06/22/16 0158   06/21/16 0532  clindamycin (CLEOCIN) IVPB 900 mg     900 mg 100 mL/hr over 30 Minutes Intravenous On call to O.R. 06/21/16 0532 06/21/16 0805       Patient was given sequential compression devices, early ambulation, and chemoprophylaxis to prevent DVT.  Patient benefited maximally from hospital stay and there were no complications.    Recent vital signs: Patient Vitals for the past 24 hrs:  BP Temp Temp src Pulse SpO2  06/22/16 0700 (!) 153/73 98.5 F (36.9 C) Oral 96 97 %  06/22/16 0132 135/68 99.9 F (37.7 C) Oral 91 98 %  06/21/16 2130 113/66 100.3 F (37.9 C) Oral 86 99 %     Recent laboratory studies:  Recent Labs  06/22/16 0344  HGB 10.1*  HCT 31.1*  NA 135  K 3.7  CL 99*  CO2 24  BUN 18  CREATININE 1.10*   GLUCOSE 195*  CALCIUM 9.6     Discharge Medications:   Allergies as of 06/22/2016      Reactions   Penicillins Rash   Rash on arms only. Has patient had a PCN reaction causing immediate rash, facial/tongue/throat swelling, SOB or lightheadedness with hypotension: No Has patient had a PCN reaction causing severe rash involving mucus membranes or skin necrosis: No Has patient had a PCN reaction that required hospitalization: No Has patient had a PCN reaction occurring within the last 10 years: No If all of the above answers are "NO", then may proceed with Cephalosporin use.      Medication List    TAKE these medications   ALPRAZolam 0.25 MG tablet Commonly known as:  XANAX take 0.125mg  by mouth every 8 hours if needed for anxiety   amitriptyline 75 MG tablet Commonly known as:  ELAVIL Take 75 mg by mouth at bedtime as needed for sleep.   aspirin 81 MG tablet Take 81 mg by mouth at bedtime.   atorvastatin 40 MG tablet Commonly known as:  LIPITOR Take 40 mg by mouth at bedtime.   ferrous sulfate 325 (65 FE) MG tablet Take 325 mg by mouth every other day.   freestyle lancets USE TO TEST BLOOD SUGARS THREE TIMES A DAY   FREESTYLE LITE test strip Generic drug:  glucose blood TEST BLOOD SUGARS THREE TIMES A DAY OR AS DIRECTED  HYDROcodone-acetaminophen 5-325 MG tablet Commonly known as:  NORCO Take 0.5-1 tablets by mouth every 4 (four) hours as needed for moderate pain.   hyoscyamine 0.125 MG Tbdp disintergrating tablet Commonly known as:  ANASPAZ Place 0.125 mg under the tongue every 6 (six) hours as needed for cramping.   lisinopril-hydrochlorothiazide 20-25 MG tablet Commonly known as:  PRINZIDE,ZESTORETIC Take 0.5 tablets by mouth at bedtime.   MELATONIN PO Take 2 tablets by mouth daily as needed.   metFORMIN 500 MG 24 hr tablet Commonly known as:  GLUCOPHAGE-XR Take 1,000 mg by mouth 2 (two) times daily.   methocarbamol 500 MG tablet Commonly known as:   ROBAXIN Take 1 tablet (500 mg total) by mouth 3 (three) times daily as needed.   multivitamin with minerals Tabs tablet Take 1 tablet by mouth daily.   potassium chloride 10 MEQ tablet Commonly known as:  K-DUR Take 10 mEq by mouth daily.       Diagnostic Studies: Dg Shoulder Right Port  Result Date: 06/21/2016 CLINICAL DATA:  Shoulder replacement. EXAM: PORTABLE RIGHT SHOULDER COMPARISON:  CT 03/28/2016. FINDINGS: Total right shoulder replacement. Hardware intact. No acute bony abnormality identified. IMPRESSION: Total right shoulder replacement. Electronically Signed   By: Marcello Moores  Register   On: 06/21/2016 10:20    Disposition: 01-Home or Self Care  Discharge Instructions    Call MD / Call 911    Complete by:  As directed    If you experience chest pain or shortness of breath, CALL 911 and be transported to the hospital emergency room.  If you develope a fever above 101 F, pus (white drainage) or increased drainage or redness at the wound, or calf pain, call your surgeon's office.   Constipation Prevention    Complete by:  As directed    Drink plenty of fluids.  Prune juice may be helpful.  You may use a stool softener, such as Colace (over the counter) 100 mg twice a day.  Use MiraLax (over the counter) for constipation as needed.   Diet - low sodium heart healthy    Complete by:  As directed    Increase activity slowly as tolerated    Complete by:  As directed       Follow-up Information    NORRIS,STEVEN R, MD. Call in 2 weeks.   Specialty:  Orthopedic Surgery Why:  [phone] Contact information: [address] [place] [address] [id number]            Signed: Cecilie Kicks. 06/22/2016, 5:40 PM

## 2016-06-22 NOTE — Progress Notes (Signed)
Subjective: 1 Day Post-Op Procedure(s) (LRB): RIGHT SHOULDER REVERSE SHOULDER ARTHROPLASTY (Right) Patient reports pain as 2 on 0-10 scale. Doing fine today.No post-Op complications.   Objective: Vital signs in last 24 hours: Temp:  [97.5 F (36.4 C)-100.3 F (37.9 C)] 98.5 F (36.9 C) (03/03 0700) Pulse Rate:  [65-96] 96 (03/03 0700) Resp:  [14-18] 16 (03/02 1225) BP: (113-153)/(56-79) 153/73 (03/03 0700) SpO2:  [97 %-100 %] 97 % (03/03 0700)  Intake/Output from previous day: 03/02 0701 - 03/03 0700 In: 960 [P.O.:210; I.V.:700; IV Piggyback:50] Out: 100 [Blood:100] Intake/Output this shift: No intake/output data recorded.   Recent Labs  06/22/16 0344  HGB 10.1*    Recent Labs  06/22/16 0344  HCT 31.1*    Recent Labs  06/22/16 0344  NA 135  K 3.7  CL 99*  CO2 24  BUN 18  CREATININE 1.10*  GLUCOSE 195*  CALCIUM 9.6   No results for input(s): LABPT, INR in the last 72 hours.  Neurovascular intact Sensation intact distally  Assessment/Plan: 1 Day Post-Op Procedure(s) (LRB): RIGHT SHOULDER REVERSE SHOULDER ARTHROPLASTY (Right) Up with therapy Discharge home with home health  Davelle Anselmi A 06/22/2016, 8:12 AM

## 2016-06-22 NOTE — Evaluation (Signed)
Occupational Therapy Evaluation/Discharge Patient Details Name: NOAMI AMOR MRN: [id number] DOB: July 18, 1945 Today's Date: 06/22/2016    History of Present Illness Pt is a 71 y.o. female s/p R reverse total shoulder arthroplasty. She has a PMH significant for diabetes mellitus and breast cancer.   Clinical Impression   PTA, pt was independent with ADL and functional mobility. She currently requires moderate assistance for UB ADL and min assist for LB ADL while adhering to shoulder precautions as well as supervision for safety with toilet transfers. Her husband demonstrates the ability to provide all necessary assistance 24 hours/day. Educated pt and husband concerning shoulder active protocol HEP, sling wear schedule, donning/doffing sling, shoulder precautions during ADL and UB dressing/bathing techniques with handouts provided. Pt and husband demonstrate understanding of all topics. No further acute OT needs identified and recommend progression of shoulder rehabilitation per MD. Acute OT will sign off.    Follow Up Recommendations  Supervision/Assistance - 24 hour (Progress rehab of shoulder per MD)    Equipment Recommendations  None recommended by OT    Recommendations for Other Services       Precautions / Restrictions Precautions Precautions: Shoulder Type of Shoulder Precautions: Active protocol: sling for comfort/sleep; NWB R UE; AROM elbow/wrist/hand; AROM/PROM shoulder (forward flexion 0-90, abduction 0-60, external rotation 0-30) Shoulder Interventions: Shoulder sling/immobilizer;Off for dressing/bathing/exercises;For comfort Precaution Booklet Issued: Yes (comment) Required Braces or Orthoses: Sling (R shoulder) Restrictions Weight Bearing Restrictions: Yes RUE Weight Bearing: Non weight bearing      Mobility Bed Mobility Overal bed mobility: Needs Assistance Bed Mobility: Rolling;Sidelying to Sit;Sit to Sidelying Rolling: Supervision Sidelying to sit: Min  assist Supine to sit: Supervision     General bed mobility comments: Husband providing assist for sidelying to sit. Demonstrates good technique with increased time.  Transfers Overall transfer level: Modified independent               General transfer comment: Increased time.    Balance Overall balance assessment: No apparent balance deficits (not formally assessed)                                          ADL Overall ADL's : Needs assistance/impaired Eating/Feeding: Set up;Sitting   Grooming: Set up;Sitting   Upper Body Bathing: Moderate assistance;Sitting   Lower Body Bathing: Minimal assistance;Sit to/from stand   Upper Body Dressing : Moderate assistance;Sitting   Lower Body Dressing: Minimal assistance;Sit to/from stand   Toilet Transfer: Supervision/safety;Ambulation   Toileting- Clothing Manipulation and Hygiene: Supervision/safety;Sit to/from stand   Tub/ Shower Transfer: Supervision/safety;Ambulation   Functional mobility during ADLs: Supervision/safety General ADL Comments: Educated pt and husband on shoulder precautions during ADL, active protocol HEP, sling wear schedule and donning/doffing sling. Husband able to provide all necessary assistance.     Vision Baseline Vision/History: Wears glasses Wears Glasses: Reading only Patient Visual Report: No change from baseline Vision Assessment?: No apparent visual deficits     Perception     Praxis      Pertinent Vitals/Pain Pain Assessment: 0-10 Pain Score: 7  Pain Location: R shoulder Pain Descriptors / Indicators: Aching;Sore Pain Intervention(s): Limited activity within patient's tolerance;Monitored during session;Repositioned;Premedicated before session     Hand Dominance Right   Extremity/Trunk Assessment Upper Extremity Assessment Upper Extremity Assessment: RUE deficits/detail RUE Deficits / Details: Decreased strength and ROM as expected post-operatively.   Lower  Extremity Assessment Lower Extremity Assessment: Overall  WFL for tasks assessed       Communication Communication Communication: No difficulties   Cognition Arousal/Alertness: Awake/alert Behavior During Therapy: WFL for tasks assessed/performed Overall Cognitive Status: Within Functional Limits for tasks assessed                     General Comments       Exercises Exercises: Shoulder     Shoulder Instructions Shoulder Instructions Donning/doffing shirt without moving shoulder: Moderate assistance Method for sponge bathing under operated UE: Moderate assistance Donning/doffing sling/immobilizer: Moderate assistance Correct positioning of sling/immobilizer: Minimal assistance ROM for elbow, wrist and digits of operated UE: Supervision/safety Sling wearing schedule (on at all times/off for ADL's): Supervision/safety Proper positioning of operated UE when showering: Supervision/safety Positioning of UE while sleeping: Minimal assistance    Home Living Family/patient expects to be discharged to:: Private residence Living Arrangements: Spouse/significant other Available Help at Discharge: Family;Available 24 hours/day Type of Home: House Home Access: Stairs to enter     Home Layout: Two level Alternate Level Stairs-Number of Steps: Flight   Bathroom Shower/Tub: Occupational psychologist: Standard     Home Equipment: Grab bars - tub/shower          Prior Functioning/Environment Level of Independence: Independent                 OT Problem List: Decreased strength;Decreased range of motion;Decreased activity tolerance;Decreased safety awareness;Pain;Impaired UE functional use      OT Treatment/Interventions:      OT Goals(Current goals can be found in the care plan section) Acute Rehab OT Goals Patient Stated Goal: to be able to use my arm again OT Goal Formulation: With patient/family Time For Goal Achievement: 06/29/16 Potential to  Achieve Goals: Good  OT Frequency:     Barriers to D/C:            Co-evaluation              End of Session Equipment Utilized During Treatment: Other (comment) (R shoulder sling) Nurse Communication: Mobility status;Other (comment) (OT complete)  Activity Tolerance: Patient tolerated treatment well Patient left: in bed;with family/visitor present (Sitting at EOB)  OT Visit Diagnosis: Pain Pain - Right/Left: Right Pain - part of body: Shoulder                ADL either performed or assessed with clinical judgement  Time: [id number] OT Time Calculation (min): 26 min Charges:  OT General Charges $OT Visit: 1 Procedure OT Evaluation $OT Eval Moderate Complexity: 1 Procedure OT Treatments $Therapeutic Exercise: 8-22 mins G-Codes:     Norman Herrlich, MS OTR/L  Pager: [place] A Ruhi Kopke 06/22/2016, 10:22 AM

## 2016-06-24 NOTE — Op Note (Signed)
NAME:  Sara Weeks, Sara Weeks                 ACCOUNT NO.:  [id number]  MEDICAL RECORD NO.:  [id number]  LOCATION:                                 FACILITY:  PHYSICIAN:  Doran Heater. Veverly Fells, M.D. DATE OF BIRTH:  [birth year]  DATE OF PROCEDURE:  06/21/2016 DATE OF DISCHARGE:                              OPERATIVE REPORT   PREOPERATIVE DIAGNOSES:  Right shoulder rotator cuff tear and osteoarthritis, rotator cuff tear arthropathy.  POSTOPERATIVE DIAGNOSES:  Right shoulder rotator cuff tear and osteoarthritis, rotator cuff tear arthropathy.  PROCEDURE PERFORMED:  Right shoulder reverse total shoulder arthroplasty using DePuy Delta Xtend prosthesis.  ATTENDING SURGEON:  Doran Heater. Veverly Fells, MD.  ASSISTANT:  Abbott Pao. Dixon, PA-C, who has scrubbed the entire procedure and necessary for satisfactory completion of surgery.  ANESTHESIA:  General anesthesia was used plus interscalene block.  ESTIMATED BLOOD LOSS:  100 mL.  FLUID REPLACEMENT:  1500 mL crystalloid.  COUNTS:  Instrument counts were correct.  COMPLICATIONS:  There were no complications.  ANTIBIOTICS:  Perioperative antibiotics were given.  INDICATIONS:  The patient is a 71 year old female who presents with progressive right shoulder pain and dysfunction secondary to rotator cuff tear arthropathy.  Having failed conservative management over an extended period time, the patient presents desiring reverse total shoulder arthroplasty to restore function and eliminate pain.  Informed consent obtained.  DESCRIPTION OF PROCEDURE:  After an adequate level of anesthesia achieved, the patient was positioned in modified beach-chair position. Right shoulder correctly identified, sterilely prepped and draped in usual manner.  Time-out was called.  We entered the shoulder using standard deltopectoral incision started at the coracoid process extending down to the anterior humerus.  Dissection down through subcutaneous tissues using the Bovie.  We  identified the cephalic vein, took it laterally with the deltoid, pectoralis taken medially.  Conjoint tendon identified and retracted medially.  Biceps was not present.  We did go ahead and place 0 Vicryl figure-of-eight suture into the pec origin there just in case there was some biceps tendon there to make sure that it was held in place.  We then released the subscapularis remnant off the lesser tuberosity.  This was not amenable for repair. We tagged it for retraction to protect the axillary nerve.  We progressively externally rotated the humerus releasing the inferior capsule.  The humeral head was extremely small.  We extended the shoulder delivering the head out of the wound.  We began reaming with a size 6 reamer and then reamed up to a size 8.  We could not get the 10 in.  At this point, we went ahead and placed our 8 mm intramedullary resection guide in place.  We resected the head in 10 degrees of retroversion with an oscillating saw.  We then went ahead and did our metaphyseal reaming in preparation with the epi-1 right metaphyseal guide.  Once we had the metaphysis prepared and milled, we did lose the majority of the medial bone.  So, we decided based on that because of extremely small size of her proximal humerus, to be using cement to augment our initial fixation.  We also have to use a hybrid implant as the  HA coating stem started at size 10 and we could only get an 8 mm. We used a [place] stem and then HA proximal metaphyseal component.  We did our trial.  It fit nicely.  We marked our version at 10 degrees of retroversion.  We then went ahead and removed the trial as would be proud and prevent this from properly preparing our glenoid.  We then retracted the humerus inferiorly and posteriorly and did a 360 glenoid labral removal and capsule removal.  We went ahead and placed our retractors, had good 360-degree visualization of extremely small glenoid face.  It was about  25-27 mm in diameter.  We drilled our central guide pin.  We milled using just hand power to ream for the base plate.  We then drilled our central peg hole and impacted the base plate in placed. This is for the DePuy Delta prosthesis.  Once the base plate was in place, we were able to get a 42 screw inferiorly and a 36 into the base of the coracoid.  We had good purchase with those 2 screws.  The anterior-posterior screws were not able to be used because of how small her glenoid was.  We had secure fixation of the base plate.  We then took a 38 standard glenosphere, impacted that, and screwed into position where it was stable.  We checked our axillary nerve to make sure it was free, clear, and not entrapped.  Once we had the base of the glenosphere in position, we went ahead and placed our trial humeral component back in place with appropriate version and then the 38+ 3 poly trial and reduced the shoulder.  It was nice and tight.  There was nice balance, smooth motion.  No gapping with inferior pole external rotation, and at this point, we removed the trial components on the humeral side.  We irrigated thoroughly, dried with a Ray-Tec sponge, and then went ahead and cemented the component in place.  Again, it is a hybrid 8 stem and an epi-1 right HA set on the 0 setting and placed in 10 degrees of retroversion.  We had secure fixation with that.  Reduced the shoulder with the real 38+ 3 poly that be impacted in place and made sure it was secure and reduced the shoulder.  Again, very stable, full motion, no tethering, no impingement.  We irrigated again thoroughly and then closed the deltopectoral interval with 0 Vicryl suture followed by 2-0 Vicryl for subcutaneous closure and 4-0 Monocryl for skin.  Steri-Strips were applied followed by sterile dressing.  The patient tolerated the surgery well.     Doran Heater. Veverly Fells, M.D.   ______________________________ Doran Heater. Veverly Fells,  M.D.    SRN/MEDQ  D:  06/21/2016  T:  06/22/2016  Job:  [id number]

## 2016-06-25 ENCOUNTER — Encounter (HOSPITAL_COMMUNITY): Payer: Self-pay | Admitting: Orthopedic Surgery

## 2016-06-26 DIAGNOSIS — M25311 Other instability, right shoulder: Secondary | ICD-10-CM | POA: Diagnosis not present

## 2016-06-26 DIAGNOSIS — Z471 Aftercare following joint replacement surgery: Secondary | ICD-10-CM | POA: Diagnosis not present

## 2016-06-26 DIAGNOSIS — Z96611 Presence of right artificial shoulder joint: Secondary | ICD-10-CM | POA: Diagnosis not present

## 2016-07-08 DIAGNOSIS — Z96611 Presence of right artificial shoulder joint: Secondary | ICD-10-CM | POA: Diagnosis not present

## 2016-07-08 DIAGNOSIS — M25311 Other instability, right shoulder: Secondary | ICD-10-CM | POA: Diagnosis not present

## 2016-07-08 DIAGNOSIS — M79641 Pain in right hand: Secondary | ICD-10-CM | POA: Diagnosis not present

## 2016-07-08 DIAGNOSIS — Z471 Aftercare following joint replacement surgery: Secondary | ICD-10-CM | POA: Diagnosis not present

## 2016-07-31 DIAGNOSIS — Z96611 Presence of right artificial shoulder joint: Secondary | ICD-10-CM | POA: Diagnosis not present

## 2016-07-31 DIAGNOSIS — M25311 Other instability, right shoulder: Secondary | ICD-10-CM | POA: Diagnosis not present

## 2016-07-31 DIAGNOSIS — G90511 Complex regional pain syndrome I of right upper limb: Secondary | ICD-10-CM | POA: Diagnosis not present

## 2016-07-31 DIAGNOSIS — Z471 Aftercare following joint replacement surgery: Secondary | ICD-10-CM | POA: Diagnosis not present

## 2016-08-06 DIAGNOSIS — G90511 Complex regional pain syndrome I of right upper limb: Secondary | ICD-10-CM | POA: Diagnosis not present

## 2016-08-09 DIAGNOSIS — G90511 Complex regional pain syndrome I of right upper limb: Secondary | ICD-10-CM | POA: Diagnosis not present

## 2016-08-12 DIAGNOSIS — G90511 Complex regional pain syndrome I of right upper limb: Secondary | ICD-10-CM | POA: Diagnosis not present

## 2016-08-14 DIAGNOSIS — G90511 Complex regional pain syndrome I of right upper limb: Secondary | ICD-10-CM | POA: Diagnosis not present

## 2016-08-20 DIAGNOSIS — G90511 Complex regional pain syndrome I of right upper limb: Secondary | ICD-10-CM | POA: Diagnosis not present

## 2016-08-22 DIAGNOSIS — G90511 Complex regional pain syndrome I of right upper limb: Secondary | ICD-10-CM | POA: Diagnosis not present

## 2016-09-10 DIAGNOSIS — G90511 Complex regional pain syndrome I of right upper limb: Secondary | ICD-10-CM | POA: Diagnosis not present

## 2016-09-11 DIAGNOSIS — Z96611 Presence of right artificial shoulder joint: Secondary | ICD-10-CM | POA: Diagnosis not present

## 2016-09-11 DIAGNOSIS — Z471 Aftercare following joint replacement surgery: Secondary | ICD-10-CM | POA: Diagnosis not present

## 2016-09-11 DIAGNOSIS — G90511 Complex regional pain syndrome I of right upper limb: Secondary | ICD-10-CM | POA: Diagnosis not present

## 2016-09-26 DIAGNOSIS — G90511 Complex regional pain syndrome I of right upper limb: Secondary | ICD-10-CM | POA: Diagnosis not present

## 2016-10-03 DIAGNOSIS — G90511 Complex regional pain syndrome I of right upper limb: Secondary | ICD-10-CM | POA: Diagnosis not present

## 2016-10-08 DIAGNOSIS — G90511 Complex regional pain syndrome I of right upper limb: Secondary | ICD-10-CM | POA: Diagnosis not present

## 2016-10-10 DIAGNOSIS — G90511 Complex regional pain syndrome I of right upper limb: Secondary | ICD-10-CM | POA: Diagnosis not present

## 2016-10-15 DIAGNOSIS — G90511 Complex regional pain syndrome I of right upper limb: Secondary | ICD-10-CM | POA: Diagnosis not present

## 2016-10-16 DIAGNOSIS — F419 Anxiety disorder, unspecified: Secondary | ICD-10-CM | POA: Diagnosis not present

## 2016-10-16 DIAGNOSIS — R5383 Other fatigue: Secondary | ICD-10-CM | POA: Diagnosis not present

## 2016-10-16 DIAGNOSIS — Z681 Body mass index (BMI) 19 or less, adult: Secondary | ICD-10-CM | POA: Diagnosis not present

## 2016-10-16 DIAGNOSIS — E1165 Type 2 diabetes mellitus with hyperglycemia: Secondary | ICD-10-CM | POA: Diagnosis not present

## 2016-10-16 DIAGNOSIS — I10 Essential (primary) hypertension: Secondary | ICD-10-CM | POA: Diagnosis not present

## 2016-10-16 DIAGNOSIS — E7800 Pure hypercholesterolemia, unspecified: Secondary | ICD-10-CM | POA: Diagnosis not present

## 2016-10-16 DIAGNOSIS — Z0000 Encounter for general adult medical examination without abnormal findings: Secondary | ICD-10-CM | POA: Diagnosis not present

## 2016-10-16 DIAGNOSIS — F332 Major depressive disorder, recurrent severe without psychotic features: Secondary | ICD-10-CM | POA: Diagnosis not present

## 2016-10-22 DIAGNOSIS — G90511 Complex regional pain syndrome I of right upper limb: Secondary | ICD-10-CM | POA: Diagnosis not present

## 2016-10-24 DIAGNOSIS — G90511 Complex regional pain syndrome I of right upper limb: Secondary | ICD-10-CM | POA: Diagnosis not present

## 2016-10-28 DIAGNOSIS — G90511 Complex regional pain syndrome I of right upper limb: Secondary | ICD-10-CM | POA: Diagnosis not present

## 2016-10-29 DIAGNOSIS — Z471 Aftercare following joint replacement surgery: Secondary | ICD-10-CM | POA: Diagnosis not present

## 2016-10-29 DIAGNOSIS — G90511 Complex regional pain syndrome I of right upper limb: Secondary | ICD-10-CM | POA: Diagnosis not present

## 2016-10-29 DIAGNOSIS — Z96611 Presence of right artificial shoulder joint: Secondary | ICD-10-CM | POA: Diagnosis not present

## 2016-11-04 DIAGNOSIS — G90511 Complex regional pain syndrome I of right upper limb: Secondary | ICD-10-CM | POA: Diagnosis not present

## 2016-11-12 DIAGNOSIS — G90511 Complex regional pain syndrome I of right upper limb: Secondary | ICD-10-CM | POA: Diagnosis not present

## 2016-11-12 DIAGNOSIS — Z471 Aftercare following joint replacement surgery: Secondary | ICD-10-CM | POA: Diagnosis not present

## 2016-11-12 DIAGNOSIS — Z96611 Presence of right artificial shoulder joint: Secondary | ICD-10-CM | POA: Diagnosis not present

## 2016-11-12 DIAGNOSIS — G5601 Carpal tunnel syndrome, right upper limb: Secondary | ICD-10-CM | POA: Diagnosis not present

## 2016-11-27 DIAGNOSIS — E1165 Type 2 diabetes mellitus with hyperglycemia: Secondary | ICD-10-CM | POA: Diagnosis not present

## 2016-11-27 DIAGNOSIS — R634 Abnormal weight loss: Secondary | ICD-10-CM | POA: Diagnosis not present

## 2016-11-27 DIAGNOSIS — F330 Major depressive disorder, recurrent, mild: Secondary | ICD-10-CM | POA: Diagnosis not present

## 2016-11-27 DIAGNOSIS — F17210 Nicotine dependence, cigarettes, uncomplicated: Secondary | ICD-10-CM | POA: Diagnosis not present

## 2016-11-27 DIAGNOSIS — Z681 Body mass index (BMI) 19 or less, adult: Secondary | ICD-10-CM | POA: Diagnosis not present

## 2016-12-25 ENCOUNTER — Ambulatory Visit
Admission: RE | Admit: 2016-12-25 | Discharge: 2016-12-25 | Disposition: A | Discharge status: Home / Self Care | Payer: Medicare Other | Source: Ambulatory Visit | Attending: Family Medicine | Admitting: Family Medicine

## 2016-12-25 DIAGNOSIS — Z1231 Encounter for screening mammogram for malignant neoplasm of breast: Secondary | ICD-10-CM | POA: Diagnosis not present

## 2016-12-26 ENCOUNTER — Other Ambulatory Visit: Payer: Self-pay | Admitting: Family Medicine

## 2016-12-26 DIAGNOSIS — R928 Other abnormal and inconclusive findings on diagnostic imaging of breast: Secondary | ICD-10-CM

## 2017-01-01 ENCOUNTER — Other Ambulatory Visit: Payer: Self-pay | Admitting: Family Medicine

## 2017-01-01 ENCOUNTER — Ambulatory Visit
Admission: RE | Admit: 2017-01-01 | Discharge: 2017-01-01 | Disposition: A | Discharge status: Home / Self Care | Payer: Medicare Other | Source: Ambulatory Visit | Attending: Family Medicine | Admitting: Family Medicine

## 2017-01-01 DIAGNOSIS — R928 Other abnormal and inconclusive findings on diagnostic imaging of breast: Secondary | ICD-10-CM

## 2017-01-01 DIAGNOSIS — R921 Mammographic calcification found on diagnostic imaging of breast: Secondary | ICD-10-CM | POA: Diagnosis not present

## 2017-01-08 ENCOUNTER — Ambulatory Visit
Admission: RE | Admit: 2017-01-08 | Discharge: 2017-01-08 | Disposition: A | Discharge status: Home / Self Care | Payer: Medicare Other | Source: Ambulatory Visit | Attending: Family Medicine | Admitting: Family Medicine

## 2017-01-08 ENCOUNTER — Other Ambulatory Visit: Payer: Self-pay | Admitting: Family Medicine

## 2017-01-08 DIAGNOSIS — R921 Mammographic calcification found on diagnostic imaging of breast: Secondary | ICD-10-CM

## 2017-01-14 DIAGNOSIS — Z96611 Presence of right artificial shoulder joint: Secondary | ICD-10-CM | POA: Diagnosis not present

## 2017-01-14 DIAGNOSIS — Z471 Aftercare following joint replacement surgery: Secondary | ICD-10-CM | POA: Diagnosis not present

## 2017-01-14 DIAGNOSIS — G90511 Complex regional pain syndrome I of right upper limb: Secondary | ICD-10-CM | POA: Diagnosis not present

## 2017-01-17 ENCOUNTER — Other Ambulatory Visit: Payer: Self-pay | Admitting: Surgery

## 2017-01-17 DIAGNOSIS — R921 Mammographic calcification found on diagnostic imaging of breast: Secondary | ICD-10-CM | POA: Diagnosis not present

## 2017-01-22 DIAGNOSIS — R5383 Other fatigue: Secondary | ICD-10-CM | POA: Diagnosis not present

## 2017-01-22 DIAGNOSIS — E1165 Type 2 diabetes mellitus with hyperglycemia: Secondary | ICD-10-CM | POA: Diagnosis not present

## 2017-01-22 DIAGNOSIS — F17210 Nicotine dependence, cigarettes, uncomplicated: Secondary | ICD-10-CM | POA: Diagnosis not present

## 2017-01-22 DIAGNOSIS — F419 Anxiety disorder, unspecified: Secondary | ICD-10-CM | POA: Diagnosis not present

## 2017-01-22 DIAGNOSIS — F330 Major depressive disorder, recurrent, mild: Secondary | ICD-10-CM | POA: Diagnosis not present

## 2017-01-22 DIAGNOSIS — I10 Essential (primary) hypertension: Secondary | ICD-10-CM | POA: Diagnosis not present

## 2017-01-22 DIAGNOSIS — G4700 Insomnia, unspecified: Secondary | ICD-10-CM | POA: Diagnosis not present

## 2017-01-22 NOTE — Pre-Procedure Instructions (Addendum)
Sara Weeks  01/22/2017      [id number] - [place], [place]. [place] [address] Phone: [phone] Fax: [phone]  [id number] [place], [place] [address] Phone: [phone] Fax: [phone]    Your procedure is scheduled on Wednesday October 10.  Report to [facility] Admitting at 9:15 A.M.  Call this number if you have problems the morning of surgery:  [phone]   Remember:  Do not eat food or drink liquids after midnight.  ** PLEASE Drink 8 oz bottle of water provided for you by 7:15 AM on the day of surgery or 2 hours prior to your arrival time to the hospital**    Take these medicines the morning of surgery with A SIP OF WATER: megestrol (megace), lyrica (pregabalin)  7 days prior to surgery STOP taking any Aspirin (unless otherwise instructed by your surgeon), Aleve, Naproxen, Ibuprofen, Motrin, Advil, Goody's, BC's, all herbal medications, fish oil, and all vitamins  DO NOT TAKE Metformin (glucophage) the day of surgery     How to Manage Your Diabetes Before and After Surgery  Why is it important to control my blood sugar before and after surgery? . Improving blood sugar levels before and after surgery helps healing and can limit problems. . A way of improving blood sugar control is eating a healthy diet by: o  Eating less sugar and carbohydrates o  Increasing activity/exercise o  Talking with your doctor about reaching your blood sugar goals . High blood sugars (greater than 180 mg/dL) can raise your risk of infections and slow your recovery, so you will need to focus on controlling your diabetes during the weeks before surgery. . Make sure that the doctor who takes care of your diabetes knows about your planned surgery including the date and location.  How do I manage my blood sugar before surgery? . Check  your blood sugar at least 4 times a day, starting 2 days before surgery, to make sure that the level is not too high or low. o Check your blood sugar the morning of your surgery when you wake up and every 2 hours until you get to the Short Stay unit. . If your blood sugar is less than 70 mg/dL, you will need to treat for low blood sugar: o Do not take insulin. o Treat a low blood sugar (less than 70 mg/dL) with  cup of clear juice (cranberry or apple), 4 glucose tablets, OR glucose gel. o Recheck blood sugar in 15 minutes after treatment (to make sure it is greater than 70 mg/dL). If your blood sugar is not greater than 70 mg/dL on recheck, call [phone] for further instructions. . Report your blood sugar to the short stay nurse when you get to Short Stay.  . If you are admitted to the hospital after surgery: o Your blood sugar will be checked by the staff and you will probably be given insulin after surgery (instead of oral diabetes medicines) to make sure you have good blood sugar levels. o The goal for blood sugar control after surgery is 80-180 mg/dL.          Do not wear jewelry, make-up or nail polish.  Do not wear lotions, powders, or perfumes, or deoderant.  Do not shave 48 hours prior to surgery.   Do not bring valuables to the hospital.  [facility] is not responsible for  any belongings or valuables.  Contacts, dentures or bridgework may not be worn into surgery.  Leave your suitcase in the car.  After surgery it may be brought to your room.  For patients admitted to the hospital, discharge time will be determined by your treatment team.  Patients discharged the day of surgery will not be allowed to drive home.   Special instructions:    [place]- Preparing For Surgery  Before surgery, you can play an important role. Because skin is not sterile, your skin needs to be as free of germs as possible. You can reduce the number of germs on your skin by washing with CHG  (chlorahexidine gluconate) Soap before surgery.  CHG is an antiseptic cleaner which kills germs and bonds with the skin to continue killing germs even after washing.  Please do not use if you have an allergy to CHG or antibacterial soaps. If your skin becomes reddened/irritated stop using the CHG.  Do not shave (including legs and underarms) for at least 48 hours prior to first CHG shower. It is OK to shave your face.  Please follow these instructions carefully.   1. Shower the NIGHT BEFORE SURGERY and the MORNING OF SURGERY with CHG.   2. If you chose to wash your hair, wash your hair first as usual with your normal shampoo.  3. After you shampoo, rinse your hair and body thoroughly to remove the shampoo.  4. Use CHG as you would any other liquid soap. You can apply CHG directly to the skin and wash gently with a scrungie or a clean washcloth.   5. Apply the CHG Soap to your body ONLY FROM THE NECK DOWN.  Do not use on open wounds or open sores. Avoid contact with your eyes, ears, mouth and genitals (private parts). Wash genitals (private parts) with your normal soap.  USE REGULAR SHAMPOO AND CONDITIONER FOR HAIR USE REGULAR SOAP FOR FACE AND PRIVATE AREA  6. Wash thoroughly, paying special attention to the area where your surgery will be performed.  7. Thoroughly rinse your body with warm water from the neck down.  8. DO NOT shower/wash with your normal soap after using and rinsing off the CHG Soap.  9. Pat yourself dry with a CLEAN TOWEL and [place] CLOTH  10. Wear CLEAN PAJAMAS to bed the night before surgery, wear comfortable clothes the morning of surgery  11. Place CLEAN SHEETS on your bed the night of your first shower and DO NOT SLEEP WITH PETS.    Day of Surgery: Do not apply any deodorants/lotions. Please wear clean clothes to the hospital/surgery center.      Please read over the following fact sheets that you were given. Coughing and Deep Breathing

## 2017-01-23 ENCOUNTER — Encounter (HOSPITAL_COMMUNITY): Payer: Self-pay

## 2017-01-23 ENCOUNTER — Encounter (HOSPITAL_COMMUNITY)
Admission: RE | Admit: 2017-01-23 | Discharge: 2017-01-23 | Disposition: A | Discharge status: Home / Self Care | Payer: Medicare Other | Source: Ambulatory Visit | Attending: Surgery | Admitting: Surgery

## 2017-01-23 DIAGNOSIS — Z79899 Other long term (current) drug therapy: Secondary | ICD-10-CM | POA: Insufficient documentation

## 2017-01-23 DIAGNOSIS — E119 Type 2 diabetes mellitus without complications: Secondary | ICD-10-CM | POA: Diagnosis not present

## 2017-01-23 DIAGNOSIS — Z7984 Long term (current) use of oral hypoglycemic drugs: Secondary | ICD-10-CM | POA: Insufficient documentation

## 2017-01-23 DIAGNOSIS — I10 Essential (primary) hypertension: Secondary | ICD-10-CM | POA: Insufficient documentation

## 2017-01-23 DIAGNOSIS — F17200 Nicotine dependence, unspecified, uncomplicated: Secondary | ICD-10-CM | POA: Insufficient documentation

## 2017-01-23 DIAGNOSIS — Z7982 Long term (current) use of aspirin: Secondary | ICD-10-CM | POA: Diagnosis not present

## 2017-01-23 DIAGNOSIS — D649 Anemia, unspecified: Secondary | ICD-10-CM | POA: Insufficient documentation

## 2017-01-23 DIAGNOSIS — Z01812 Encounter for preprocedural laboratory examination: Secondary | ICD-10-CM | POA: Diagnosis not present

## 2017-01-23 DIAGNOSIS — C50912 Malignant neoplasm of unspecified site of left female breast: Secondary | ICD-10-CM | POA: Diagnosis not present

## 2017-01-23 HISTORY — DX: Malignant (primary) neoplasm, unspecified: C80.1

## 2017-01-23 LAB — GLUCOSE, CAPILLARY: GLUCOSE-CAPILLARY: 103 mg/dL — AB (ref 65–99)

## 2017-01-23 NOTE — Progress Notes (Signed)
PCP: Dr. Wayne Sever at [facility] Cardiologist: Denies  EKG: Nov 2017 Denies Stess, ECHO or Cardiac Cath  Fasting Blood Sugar 100 Checks Blood Sugar twice daily at home  Patient denies shortness of breath, fever, cough, and chest pain at PAT appointment.  Patient verbalized understanding of instructions provided today at the PAT appointment.  Patient asked to review instructions at home and day of surgery.   Patient reports lab work completed yesterday at Dr. Inda Merlin' office.  Release of information obtained and faxed to MD requesting results of lab work.  Pt understands that if lab work not received, she will have labs DOS.

## 2017-01-24 ENCOUNTER — Encounter (HOSPITAL_COMMUNITY): Payer: Self-pay | Admitting: Emergency Medicine

## 2017-01-24 NOTE — Progress Notes (Signed)
Anesthesia Chart Review:  Pt is a 71 year old female scheduled for L breast lumpectomy with radioactive seed localization on 01/29/2017 with Coralie Keens, MD  - PCP is Darcus Austin, MD - Saw cardiologist is Candee Furbish, MD 03/01/16 for pre-op eval for shoulder arthroplasty due to abnormal EKG.  Per Dr. Marlou Porch' note, concern for hypertrophic cardiomyopathy.  He ordered MRI which did not show this, and cleared pt for shoulder surgery.  F/u prn recommended.   PMH includes:  HTN, DM, anemia, breast cancer. Current smoker. BMI 16.5. S/p R reverse shoulder arthroplasty 06/21/16  Medications include: ASA 81 mg, Lipitor, iron, lisinopril-HCTZ, metformin, potassium.  Labs 01/22/17 from PCP's office reviewed:  - CBC and CMET acceptable for surgery; on paper chart - H/H 10.3/31.5.  - HbA1c 7.9, glucose 132  EKG 03/01/16: sinus rhythm, T-wave inversion diffusely with highest amplitude being in V4. Heart rate 90 bpm. Concern for an apical variant hypertrophic cardiomyopathy pattern.  Cardiac CT 03/12/16:  1. Normal left ventricular size, thickness and systolic function (LVEF = 61%) with no regional wall motion abnormalities. No evidence of late gadolinium enhancement in the left ventricular myocardium. 2. Normal right ventricular size, thickness and systolic function (LVEF = 55%) with no regional wall motion abnormalities. 3. Normal biatrial size. 4. Trivial mitral and tricuspid regurgitation. - This study is of limited quality as the patient was unable to hold breath and there is associated motion, however there is no evidence for hypertrophic cardiomyopathy.  If no changes, I anticipate pt can proceed with surgery as scheduled.   Willeen Cass, FNP-BC [facility] Short Stay Surgical Center/Anesthesiology Phone: [phone] 01/27/2017 4:29 PM

## 2017-01-28 ENCOUNTER — Other Ambulatory Visit: Payer: Self-pay | Admitting: Surgery

## 2017-01-28 ENCOUNTER — Ambulatory Visit
Admission: RE | Admit: 2017-01-28 | Discharge: 2017-01-28 | Disposition: A | Discharge status: Home / Self Care | Payer: Medicare Other | Source: Ambulatory Visit | Attending: Surgery | Admitting: Surgery

## 2017-01-28 DIAGNOSIS — R921 Mammographic calcification found on diagnostic imaging of breast: Secondary | ICD-10-CM

## 2017-01-28 MED ORDER — CIPROFLOXACIN IN D5W 400 MG/200ML IV SOLN: 400.0000 mg | INTRAVENOUS | Status: DC

## 2017-01-29 ENCOUNTER — Ambulatory Visit
Admission: RE | Admit: 2017-01-29 | Discharge: 2017-01-29 | Disposition: A | Discharge status: Home / Self Care | Payer: Medicare Other | Source: Ambulatory Visit | Attending: Surgery | Admitting: Surgery

## 2017-01-29 ENCOUNTER — Encounter (HOSPITAL_COMMUNITY): Admission: RE | Payer: Self-pay | Source: Ambulatory Visit

## 2017-01-29 ENCOUNTER — Ambulatory Visit (HOSPITAL_COMMUNITY): Admission: RE | Admit: 2017-01-29 | Payer: Medicare Other | Source: Ambulatory Visit | Admitting: Surgery

## 2017-01-29 DIAGNOSIS — D509 Iron deficiency anemia, unspecified: Secondary | ICD-10-CM | POA: Diagnosis not present

## 2017-01-29 DIAGNOSIS — E1165 Type 2 diabetes mellitus with hyperglycemia: Secondary | ICD-10-CM | POA: Diagnosis not present

## 2017-01-29 DIAGNOSIS — R921 Mammographic calcification found on diagnostic imaging of breast: Secondary | ICD-10-CM

## 2017-01-29 SURGERY — BREAST LUMPECTOMY WITH RADIOACTIVE SEED LOCALIZATION
Anesthesia: General | Site: Breast | Laterality: Left

## 2017-02-10 ENCOUNTER — Other Ambulatory Visit: Payer: Self-pay | Admitting: Surgery

## 2017-02-11 NOTE — Pre-Procedure Instructions (Signed)
Sara Weeks  02/11/2017      [facility] - [place], [place] - [place] [place] [address] Phone: [phone] Fax: [phone]  [id number] [place], [place] [address] Phone: [phone] Fax: [phone]    Your procedure is scheduled on 02/14/17  Report to [facility] at [place].M.  Call this number if you have problems the morning of surgery:  [phone]   Remember:  Do not eat food or drink liquids after midnight.  Continue all other medications as directed by your physician except follow these medication instructions before surgery   Take these medicines the morning of surgery with A SIP OF WATER  pregabalin (LYRICA)   7 days prior to surgery STOP taking any Aspirin (unless otherwise instructed by your surgeon), Aleve, Naproxen, Ibuprofen, Motrin, Advil, Goody's, BC's, all herbal medications, fish oil, and all vitamins   WHAT DO I DO ABOUT MY DIABETES MEDICATION?   Mar[place] Kitchen Do not take oral diabetes medicines (pills) the morning of surgery. metFORMIN (GLUCOPHAGE-XR)  How to Manage Your Diabetes Before and After Surgery  Why is it important to control my blood sugar before and after surgery? . Improving blood sugar levels before and after surgery helps healing and can limit problems. . A way of improving blood sugar control is eating a healthy diet by: o  Eating less sugar and carbohydrates o  Increasing activity/exercise o  Talking with your doctor about reaching your blood sugar goals . High blood sugars (greater than 180 mg/dL) can raise your risk of infections and slow your recovery, so you will need to focus on controlling your diabetes during the weeks before surgery. . Make sure that the doctor who takes care of your diabetes knows about your planned surgery including the date and location.  How do I manage my blood  sugar before surgery? . Check your blood sugar at least 4 times a day, starting 2 days before surgery, to make sure that the level is not too high or low. o Check your blood sugar the morning of your surgery when you wake up and every 2 hours until you get to the Short Stay unit. . If your blood sugar is less than 70 mg/dL, you will need to treat for low blood sugar: o Do not take insulin. o Treat a low blood sugar (less than 70 mg/dL) with  cup of clear juice (cranberry or apple), 4 glucose tablets, OR glucose gel. o Recheck blood sugar in 15 minutes after treatment (to make sure it is greater than 70 mg/dL). If your blood sugar is not greater than 70 mg/dL on recheck, call [phone] for further instructions. . Report your blood sugar to the short stay nurse when you get to Short Stay.  . If you are admitted to the hospital after surgery: o Your blood sugar will be checked by the staff and you will probably be given insulin after surgery (instead of oral diabetes medicines) to make sure you have good blood sugar levels. o The goal for blood sugar control after surgery is 80-180 mg/dL.    Do not wear jewelry, make-up or nail polish.  Do not wear lotions, powders, or perfumes, or deoderant.  Do not shave 48 hours prior to surgery.  Men may shave face and neck.  Do not bring valuables to the hospital.  [facility] is not responsible for any belongings or valuables.  Contacts, dentures or bridgework may not be worn into surgery.  Leave your suitcase in the car.  After surgery it may be brought to your room.  For patients admitted to the hospital, discharge time will be determined by your treatment team.  Patients discharged the day of surgery will not be allowed to drive home.    Special instructions:   [place]- Preparing For Surgery  Before surgery, you can play an important role. Because skin is not sterile, your skin needs to be as free of germs as possible. You can reduce the  number of germs on your skin by washing with CHG (chlorahexidine gluconate) Soap before surgery.  CHG is an antiseptic cleaner which kills germs and bonds with the skin to continue killing germs even after washing.  Please do not use if you have an allergy to CHG or antibacterial soaps. If your skin becomes reddened/irritated stop using the CHG.  Do not shave (including legs and underarms) for at least 48 hours prior to first CHG shower. It is OK to shave your face.  Please follow these instructions carefully.   1. Shower the NIGHT BEFORE SURGERY and the MORNING OF SURGERY with CHG.   2. If you chose to wash your hair, wash your hair first as usual with your normal shampoo.  3. After you shampoo, rinse your hair and body thoroughly to remove the shampoo.  4. Use CHG as you would any other liquid soap. You can apply CHG directly to the skin and wash gently with a scrungie or a clean washcloth.   5. Apply the CHG Soap to your body ONLY FROM THE NECK DOWN.  Do not use on open wounds or open sores. Avoid contact with your eyes, ears, mouth and genitals (private parts). Wash Face and genitals (private parts)  with your normal soap.  6. Wash thoroughly, paying special attention to the area where your surgery will be performed.  7. Thoroughly rinse your body with warm water from the neck down.  8. DO NOT shower/wash with your normal soap after using and rinsing off the CHG Soap.  9. Pat yourself dry with a CLEAN TOWEL.  10. Wear CLEAN PAJAMAS to bed the night before surgery, wear comfortable clothes the morning of surgery  11. Place CLEAN SHEETS on your bed the night of your first shower and DO NOT SLEEP WITH PETS.    Day of Surgery: Do not apply any deodorants/lotions. Please wear clean clothes to the hospital/surgery center.      Please read over the following fact sheets that you were given.

## 2017-02-12 ENCOUNTER — Encounter (HOSPITAL_COMMUNITY)
Admission: RE | Admit: 2017-02-12 | Discharge: 2017-02-12 | Disposition: A | Discharge status: Home / Self Care | Payer: Medicare Other | Source: Ambulatory Visit | Attending: Surgery | Admitting: Surgery

## 2017-02-12 ENCOUNTER — Encounter (HOSPITAL_COMMUNITY): Payer: Self-pay

## 2017-02-12 ENCOUNTER — Ambulatory Visit
Admission: RE | Admit: 2017-02-12 | Discharge: 2017-02-12 | Disposition: A | Discharge status: Home / Self Care | Payer: Medicare Other | Source: Ambulatory Visit | Attending: Surgery | Admitting: Surgery

## 2017-02-12 DIAGNOSIS — N6012 Diffuse cystic mastopathy of left breast: Secondary | ICD-10-CM | POA: Diagnosis not present

## 2017-02-12 DIAGNOSIS — D242 Benign neoplasm of left breast: Secondary | ICD-10-CM | POA: Diagnosis not present

## 2017-02-12 DIAGNOSIS — M1990 Unspecified osteoarthritis, unspecified site: Secondary | ICD-10-CM | POA: Diagnosis not present

## 2017-02-12 DIAGNOSIS — I10 Essential (primary) hypertension: Secondary | ICD-10-CM | POA: Diagnosis not present

## 2017-02-12 DIAGNOSIS — E119 Type 2 diabetes mellitus without complications: Secondary | ICD-10-CM | POA: Diagnosis not present

## 2017-02-12 DIAGNOSIS — Z880 Allergy status to penicillin: Secondary | ICD-10-CM | POA: Diagnosis not present

## 2017-02-12 DIAGNOSIS — F329 Major depressive disorder, single episode, unspecified: Secondary | ICD-10-CM | POA: Diagnosis not present

## 2017-02-12 DIAGNOSIS — Z7984 Long term (current) use of oral hypoglycemic drugs: Secondary | ICD-10-CM | POA: Diagnosis not present

## 2017-02-12 DIAGNOSIS — Z79899 Other long term (current) drug therapy: Secondary | ICD-10-CM | POA: Diagnosis not present

## 2017-02-12 DIAGNOSIS — Z7982 Long term (current) use of aspirin: Secondary | ICD-10-CM | POA: Diagnosis not present

## 2017-02-12 DIAGNOSIS — Z853 Personal history of malignant neoplasm of breast: Secondary | ICD-10-CM | POA: Diagnosis not present

## 2017-02-12 DIAGNOSIS — R921 Mammographic calcification found on diagnostic imaging of breast: Secondary | ICD-10-CM | POA: Diagnosis present

## 2017-02-12 DIAGNOSIS — E7800 Pure hypercholesterolemia, unspecified: Secondary | ICD-10-CM | POA: Diagnosis not present

## 2017-02-12 LAB — CBC
HCT: 32.5 % — ABNORMAL LOW (ref 36.0–46.0)
Hemoglobin: 10.6 g/dL — ABNORMAL LOW (ref 12.0–15.0)
MCH: 32.1 pg (ref 26.0–34.0)
MCHC: 32.6 g/dL (ref 30.0–36.0)
MCV: 98.5 fL (ref 78.0–100.0)
Platelets: 263 10*3/uL (ref 150–400)
RBC: 3.3 MIL/uL — ABNORMAL LOW (ref 3.87–5.11)
RDW: 16 % — AB (ref 11.5–15.5)
WBC: 8 10*3/uL (ref 4.0–10.5)

## 2017-02-12 LAB — BASIC METABOLIC PANEL
Anion gap: 10 (ref 5–15)
BUN: 24 mg/dL — AB (ref 6–20)
CALCIUM: 9.4 mg/dL (ref 8.9–10.3)
CO2: 19 mmol/L — ABNORMAL LOW (ref 22–32)
Chloride: 105 mmol/L (ref 101–111)
Creatinine, Ser: 1.44 mg/dL — ABNORMAL HIGH (ref 0.44–1.00)
GFR calc Af Amer: 42 mL/min — ABNORMAL LOW (ref >60)
GFR, EST NON AFRICAN AMERICAN: 36 mL/min — AB (ref >60)
GLUCOSE: 170 mg/dL — AB (ref 65–99)
POTASSIUM: 3.8 mmol/L (ref 3.5–5.1)
Sodium: 134 mmol/L — ABNORMAL LOW (ref 135–145)

## 2017-02-12 LAB — GLUCOSE, CAPILLARY: Glucose-Capillary: 153 mg/dL — ABNORMAL HIGH (ref 65–99)

## 2017-02-12 LAB — HEMOGLOBIN A1C
Hgb A1c MFr Bld: 8 % — ABNORMAL HIGH (ref 4.8–5.6)
Mean Plasma Glucose: 182.9 mg/dL

## 2017-02-13 NOTE — H&P (Signed)
Sara Weeks 01/17/2017 11:06 AM Location: [facility] Patient #: [id number] DOB: 11/27/1945 Married / Language: English / Race: Black or African American Female   History of Present Illness ([facility] A. Ninfa Linden MD; 01/17/2017 11:38 AM) The patient is a 70 year old female who presents with a complaint of Breast problems. This patient is referred by Dr. Ammie Ferrier for evaluation of abnormal calcifications of left breast. She has a prior history of right breast cancer and status post lumpectomy with radiation therapy approximately 15 years ago. She has had multiple biopsies of the left breast which have always been benign. On her most recent mammogram, she had abnormal calcifications in the upper outer quadrant of the left breast. These could not be biopsied with stereotactic guidance based on how small she is. An ultrasound was not performed today. She denies nipple discharge. She is otherwise without complaints.   Past Surgical History Malachi Bonds, CMA; 01/17/2017 11:07 AM) Breast Biopsy  Bilateral. multiple Breast Mass; Local Excision  Right. Hysterectomy (not due to cancer) - Partial  Oral Surgery  Resection of Small Bowel   Diagnostic Studies History Malachi Bonds, CMA; 01/17/2017 11:07 AM) Colonoscopy  1-5 years ago Mammogram  within last year Pap Smear  >5 years ago  Allergies Malachi Bonds, CMA; 01/17/2017 11:08 AM) Penicillins   Medication History Malachi Bonds, CMA; 01/17/2017 11:09 AM) Aspirin (81MG  Tablet, Oral) Active. Lisinopril-Hydrochlorothiazide (20-25MG  Tablet, Oral) Active. Lyrica (75MG  Capsule, Oral) Active. Amitriptyline HCl (75MG  Tablet, Oral) Active. MetFORMIN HCl (500MG  Tablet, Oral) Active. Atorvastatin Calcium (40MG  Tablet, Oral) Active. Medications Reconciled  Social History Malachi Bonds, CMA; 01/17/2017 11:07 AM) Alcohol use  Remotely quit alcohol use. Caffeine use  Coffee. No drug use  Tobacco use   Current every day smoker.  Family History Malachi Bonds, CMA; 01/17/2017 11:07 AM) Arthritis  Father. Diabetes Mellitus  Father. Heart Disease  Mother. Heart disease in female family member before age 48  Hypertension  Father, Mother. Seizure disorder  Brother. Thyroid problems  Sister.  Pregnancy / Birth History Malachi Bonds, CMA; 01/17/2017 11:07 AM) Age at menarche  80 years. Age of menopause  68-60 Gravida  0 Para  0  Other Problems Malachi Bonds, CMA; 01/17/2017 11:07 AM) Breast Cancer  Depression  Diabetes Mellitus  High blood pressure  Hypercholesterolemia  Pancreatitis     Review of Systems (Chemira Jones CMA; 01/17/2017 11:07 AM) General Present- Fatigue and Weight Loss. Not Present- Appetite Loss, Chills, Fever, Night Sweats and Weight Gain. Skin Not Present- Change in Wart/Mole, Dryness, Hives, Jaundice, New Lesions, Non-Healing Wounds, Rash and Ulcer. HEENT Not Present- Earache, Hearing Loss, Hoarseness, Nose Bleed, Oral Ulcers, Ringing in the Ears, Seasonal Allergies, Sinus Pain, Sore Throat, Visual Disturbances, Wears glasses/contact lenses and Yellow Eyes. Respiratory Not Present- Bloody sputum, Chronic Cough, Difficulty Breathing, Snoring and Wheezing. Breast Present- Breast Pain. Not Present- Breast Mass, Nipple Discharge and Skin Changes. Cardiovascular Not Present- Chest Pain, Difficulty Breathing Lying Down, Leg Cramps, Palpitations, Rapid Heart Rate, Shortness of Breath and Swelling of Extremities. Gastrointestinal Not Present- Abdominal Pain, Bloating, Bloody Stool, Change in Bowel Habits, Chronic diarrhea, Constipation, Difficulty Swallowing, Excessive gas, Gets full quickly at meals, Hemorrhoids, Indigestion, Nausea, Rectal Pain and Vomiting. Female Genitourinary Not Present- Frequency, Nocturia, Painful Urination, Pelvic Pain and Urgency. Musculoskeletal Present- Muscle Pain and Muscle Weakness. Not Present- Back Pain, Joint Pain, Joint  Stiffness and Swelling of Extremities. Neurological Not Present- Decreased Memory, Fainting, Headaches, Numbness, Seizures, Tingling, Tremor, Trouble walking and Weakness. Psychiatric Present- Depression. Not  Present- Anxiety, Bipolar, Change in Sleep Pattern, Fearful and Frequent crying. Endocrine Present- New Diabetes. Not Present- Cold Intolerance, Excessive Hunger, Hair Changes, Heat Intolerance and Hot flashes. Hematology Not Present- Blood Thinners, Easy Bruising, Excessive bleeding, Gland problems, HIV and Persistent Infections.  Vitals (Chemira Jones CMA; 01/17/2017 11:08 AM) 01/17/2017 11:07 AM Weight: 84.4 lb Height: 59in Body Surface Area: 1.28 m Body Mass Index: 17.05 kg/m  Temp.: 98.33F(Oral)  Pulse: 77 (Regular)  BP: 114/70 (Sitting, Left Arm, Standard)       Physical Exam (Princess Karnes A. Ninfa Linden MD; 01/17/2017 11:39 AM) General Mental Status-Alert. General Appearance-Consistent with stated age. Hydration-Well hydrated. Voice-Normal.  Head and Neck Head-normocephalic, atraumatic with no lesions or palpable masses. Trachea-midline. Thyroid Gland Characteristics - normal size and consistency.  Eye Eyeball - Bilateral-Extraocular movements intact. Sclera/Conjunctiva - Bilateral-No scleral icterus.  Chest and Lung Exam Chest and lung exam reveals -quiet, even and easy respiratory effort with no use of accessory muscles and on auscultation, normal breath sounds, no adventitious sounds and normal vocal resonance. Inspection Chest Wall - Normal. Back - normal.  Breast Breast - Left-Symmetric and Biopsy scar, Non Tender, No Dimpling, No Inflammation, No Lumpectomy scars, No Mastectomy scars, No Peau d' Orange. Breast - Right-Symmetric and Biopsy scar, Non Tender, No Dimpling, No Inflammation, No Lumpectomy scars, No Mastectomy scars, No Peau d' Orange. Breast Lump-No Palpable Breast Mass.  Cardiovascular Cardiovascular examination  reveals -normal heart sounds, regular rate and rhythm with no murmurs and normal pedal pulses bilaterally.  Abdomen Inspection Inspection of the abdomen reveals - No Hernias. Skin - Scar - no surgical scars. Palpation/Percussion Palpation and Percussion of the abdomen reveal - Soft, Non Tender, No Rebound tenderness, No Rigidity (guarding) and No hepatosplenomegaly. Auscultation Auscultation of the abdomen reveals - Bowel sounds normal.  Neurologic - Did not examine.  Musculoskeletal - Did not examine.  Lymphatic Head & Neck  General Head & Neck Lymphatics: Bilateral - Description - Normal. Axillary  General Axillary Region: Bilateral - Description - Normal. Tenderness - Non Tender. Femoral & Inguinal - Did not examine.    Assessment & Plan (Addysyn Fern A. Ninfa Linden MD; 01/17/2017 11:41 AM) BREAST CALCIFICATION, LEFT (R92.1) Impression: I discussed the diagnosis with the patient and her husband in detail. As she has had multiple procedures on both her breasts, she is well aware of what needs to be done. Given her history of the right breast cancer as well as the suspicious appearance of the consultations and the left breast, lumpectomy is recommended of this area in the left breast. We will try this with left breast right at a CT-guided lumpectomy if they were able to place one under ultrasound guidance. If not, she will need a needle localized left breast lumpectomy. I discussed the procedure with him in detail. We discussed the risks of bleeding, infection, need for further surgery if malignancy is found, cardiopulmonary issues, etc. They understand and agree to proceed with surgery

## 2017-02-13 NOTE — Anesthesia Preprocedure Evaluation (Addendum)
Anesthesia Evaluation  Patient identified by MRN, date of birth, ID band Patient awake    Reviewed: Allergy & Precautions, H&P , NPO status , Patient's Chart, lab work & pertinent test results  Airway Mallampati: II  TM Distance: >3 FB Neck ROM: Full    Dental no notable dental hx. (+) Partial Upper, Dental Advisory Given   Pulmonary Current Smoker,    Pulmonary exam normal breath sounds clear to auscultation       Cardiovascular Exercise Tolerance: Good hypertension, Pt. on medications  Rhythm:Regular Rate:Normal     Neuro/Psych Depression negative neurological ROS     GI/Hepatic negative GI ROS, Neg liver ROS,   Endo/Other  diabetes, Type 2, Oral Hypoglycemic Agents  Renal/GU negative Renal ROS  negative genitourinary   Musculoskeletal  (+) Arthritis , Osteoarthritis,    Abdominal   Peds  Hematology negative hematology ROS (+) anemia ,   Anesthesia Other Findings   Reproductive/Obstetrics negative OB ROS                             Lab Results  Component Value Date   WBC 8.0 02/12/2017   HGB 10.6 (L) 02/12/2017   HCT 32.5 (L) 02/12/2017   MCV 98.5 02/12/2017   PLT 263 02/12/2017   Lab Results  Component Value Date   CREATININE 1.44 (H) 02/12/2017   BUN 24 (H) 02/12/2017   NA 134 (L) 02/12/2017   K 3.8 02/12/2017   CL 105 02/12/2017   CO2 19 (L) 02/12/2017    Anesthesia Physical  Anesthesia Plan  ASA: II  Anesthesia Plan: General   Post-op Pain Management:    Induction: Intravenous  PONV Risk Score and Plan: 2 and Ondansetron, Dexamethasone and Treatment may vary due to age or medical condition  Airway Management Planned: LMA  Additional Equipment:   Intra-op Plan:   Post-operative Plan: Extubation in OR  Informed Consent: I have reviewed the patients History and Physical, chart, labs and discussed the procedure including the risks, benefits and  alternatives for the proposed anesthesia with the patient or authorized representative who has indicated his/her understanding and acceptance.   Dental advisory given  Plan Discussed with: CRNA  Anesthesia Plan Comments:        Anesthesia Quick Evaluation

## 2017-02-13 NOTE — Progress Notes (Signed)
Anesthesia Chart Review:  Pt is a 71 year old female scheduled for L breast lumpectomy with radioactive seed localization on 02/14/2017 with Coralie Keens, MD  Surgery was originally scheduled for 01/29/17; reason for postponing surgery is not documented.   - PCP is Darcus Austin, MD - Saw cardiologist is Candee Furbish, MD 03/01/16 for pre-op eval for shoulder arthroplasty due to abnormal EKG.  Per Dr. Marlou Porch' note, concern for hypertrophic cardiomyopathy.  He ordered MRI which did not show this, and cleared pt for shoulder surgery.  F/u prn recommended.   PMH includes:  HTN, DM, anemia, breast cancer. Current smoker. BMI 16. S/p R reverse shoulder arthroplasty 06/21/16  Medications include: ASA 81 mg, Lipitor, iron, lisinopril-HCTZ, metformin, potassium.  Preoperative labs reviewed.  HbA1c 8.0, glucose 170  EKG 03/01/16: sinus rhythm, T-wave inversion diffusely with highest amplitude being in V4. Heart rate 90 bpm. Concern for an apical variant hypertrophic cardiomyopathy pattern.  Cardiac CT 03/12/16:  1. Normal left ventricular size, thickness and systolic function (LVEF = 61%) with no regional wall motion abnormalities. No evidence of late gadolinium enhancement in the left ventricular myocardium. 2. Normal right ventricular size, thickness and systolic function (LVEF = 55%) with no regional wall motion abnormalities. 3. Normal biatrial size. 4. Trivial mitral and tricuspid regurgitation. - This study is of limited quality as the patient was unable to hold breath and there is associated motion, however there is no evidence for hypertrophic cardiomyopathy.  If no changes, I anticipate pt can proceed with surgery as scheduled.   Willeen Cass, FNP-BC [facility]/Anesthesiology Phone: [phone] 02/13/2017 10:01 AM

## 2017-02-14 ENCOUNTER — Encounter (HOSPITAL_COMMUNITY): Payer: Self-pay | Admitting: *Deleted

## 2017-02-14 ENCOUNTER — Ambulatory Visit (HOSPITAL_COMMUNITY): Payer: Medicare Other | Admitting: Anesthesiology

## 2017-02-14 ENCOUNTER — Ambulatory Visit (HOSPITAL_BASED_OUTPATIENT_CLINIC_OR_DEPARTMENT_OTHER)
Admission: RE | Admit: 2017-02-14 | Discharge: 2017-02-14 | Disposition: A | Discharge status: Home / Self Care | Payer: Medicare Other | Source: Ambulatory Visit | Attending: Surgery | Admitting: Surgery

## 2017-02-14 ENCOUNTER — Encounter (HOSPITAL_COMMUNITY)
Admission: RE | Disposition: A | Discharge status: Home / Self Care | Payer: Self-pay | Source: Ambulatory Visit | Attending: Surgery

## 2017-02-14 ENCOUNTER — Ambulatory Visit
Admission: RE | Admit: 2017-02-14 | Discharge: 2017-02-14 | Disposition: A | Discharge status: Home / Self Care | Payer: Medicare Other | Source: Ambulatory Visit | Attending: Surgery | Admitting: Surgery

## 2017-02-14 ENCOUNTER — Ambulatory Visit (HOSPITAL_COMMUNITY): Payer: Medicare Other | Admitting: Emergency Medicine

## 2017-02-14 DIAGNOSIS — Z7982 Long term (current) use of aspirin: Secondary | ICD-10-CM | POA: Diagnosis not present

## 2017-02-14 DIAGNOSIS — E7800 Pure hypercholesterolemia, unspecified: Secondary | ICD-10-CM | POA: Insufficient documentation

## 2017-02-14 DIAGNOSIS — Z853 Personal history of malignant neoplasm of breast: Secondary | ICD-10-CM | POA: Insufficient documentation

## 2017-02-14 DIAGNOSIS — Z7984 Long term (current) use of oral hypoglycemic drugs: Secondary | ICD-10-CM | POA: Insufficient documentation

## 2017-02-14 DIAGNOSIS — I10 Essential (primary) hypertension: Secondary | ICD-10-CM | POA: Insufficient documentation

## 2017-02-14 DIAGNOSIS — F329 Major depressive disorder, single episode, unspecified: Secondary | ICD-10-CM | POA: Insufficient documentation

## 2017-02-14 DIAGNOSIS — Z880 Allergy status to penicillin: Secondary | ICD-10-CM | POA: Diagnosis not present

## 2017-02-14 DIAGNOSIS — D242 Benign neoplasm of left breast: Secondary | ICD-10-CM | POA: Insufficient documentation

## 2017-02-14 DIAGNOSIS — N6012 Diffuse cystic mastopathy of left breast: Secondary | ICD-10-CM | POA: Diagnosis not present

## 2017-02-14 DIAGNOSIS — R921 Mammographic calcification found on diagnostic imaging of breast: Secondary | ICD-10-CM

## 2017-02-14 DIAGNOSIS — Z79899 Other long term (current) drug therapy: Secondary | ICD-10-CM | POA: Diagnosis not present

## 2017-02-14 DIAGNOSIS — E119 Type 2 diabetes mellitus without complications: Secondary | ICD-10-CM | POA: Insufficient documentation

## 2017-02-14 DIAGNOSIS — M1990 Unspecified osteoarthritis, unspecified site: Secondary | ICD-10-CM | POA: Insufficient documentation

## 2017-02-14 DIAGNOSIS — N6489 Other specified disorders of breast: Secondary | ICD-10-CM | POA: Diagnosis not present

## 2017-02-14 HISTORY — PX: BREAST LUMPECTOMY WITH RADIOACTIVE SEED LOCALIZATION: SHX6424

## 2017-02-14 LAB — GLUCOSE, CAPILLARY
Glucose-Capillary: 93 mg/dL (ref 65–99)
Glucose-Capillary: 143 mg/dL — ABNORMAL HIGH (ref 65–99)

## 2017-02-14 SURGERY — BREAST LUMPECTOMY WITH RADIOACTIVE SEED LOCALIZATION
Anesthesia: General | Site: Breast | Laterality: Left

## 2017-02-14 MED ORDER — FENTANYL CITRATE (PF) 100 MCG/2ML IJ SOLN
INTRAMUSCULAR | Status: DC | PRN
  Administered 2017-02-14: 50 ug via INTRAVENOUS

## 2017-02-14 MED ORDER — DEXAMETHASONE SODIUM PHOSPHATE 4 MG/ML IJ SOLN
INTRAMUSCULAR | Status: DC | PRN
  Administered 2017-02-14: 8 mg via INTRAVENOUS

## 2017-02-14 MED ORDER — FENTANYL CITRATE (PF) 100 MCG/2ML IJ SOLN: 25.0000 ug | INTRAMUSCULAR | Status: DC | PRN

## 2017-02-14 MED ORDER — SODIUM CHLORIDE 0.9% FLUSH: 3.0000 mL | Freq: Two times a day (BID) | INTRAVENOUS | Status: DC

## 2017-02-14 MED ORDER — SODIUM CHLORIDE 0.9% FLUSH: 3.0000 mL | INTRAVENOUS | Status: DC | PRN

## 2017-02-14 MED ORDER — SODIUM CHLORIDE 0.9 % IV SOLN: 250.0000 mL | INTRAVENOUS | Status: DC | PRN

## 2017-02-14 MED ORDER — SODIUM CHLORIDE 0.9 % IV SOLN: INTRAVENOUS | Status: DC | PRN

## 2017-02-14 MED ORDER — LIDOCAINE 2% (20 MG/ML) 5 ML SYRINGE
INTRAMUSCULAR | Status: DC | PRN
  Administered 2017-02-14: 50 mg via INTRAVENOUS

## 2017-02-14 MED ORDER — OXYCODONE HCL 5 MG PO TABS: 5.0000 mg | ORAL_TABLET | Freq: Four times a day (QID) | ORAL | 0 refills | Status: DC | PRN

## 2017-02-14 MED ORDER — EPHEDRINE SULFATE 50 MG/ML IJ SOLN
INTRAMUSCULAR | Status: DC | PRN
  Administered 2017-02-14: 10 mg via INTRAVENOUS
  Administered 2017-02-14: 15 mg via INTRAVENOUS
  Administered 2017-02-14: 10 mg via INTRAVENOUS

## 2017-02-14 MED ORDER — FENTANYL CITRATE (PF) 250 MCG/5ML IJ SOLN
INTRAMUSCULAR | Status: AC
  Filled 2017-02-14: qty 5

## 2017-02-14 MED ORDER — PROPOFOL 10 MG/ML IV BOLUS
INTRAVENOUS | Status: AC
  Filled 2017-02-14: qty 40

## 2017-02-14 MED ORDER — BUPIVACAINE-EPINEPHRINE (PF) 0.25% -1:200000 IJ SOLN
INTRAMUSCULAR | Status: AC
  Filled 2017-02-14: qty 30

## 2017-02-14 MED ORDER — LACTATED RINGERS IV SOLN
INTRAVENOUS | Status: DC | PRN
  Administered 2017-02-14: 07:00:00 via INTRAVENOUS

## 2017-02-14 MED ORDER — BUPIVACAINE-EPINEPHRINE 0.25% -1:200000 IJ SOLN
INTRAMUSCULAR | Status: DC | PRN
  Administered 2017-02-14: 6 mL

## 2017-02-14 MED ORDER — CEFAZOLIN SODIUM-DEXTROSE 2-3 GM-%(50ML) IV SOLR
INTRAVENOUS | Status: DC | PRN
  Administered 2017-02-14: 2 g via INTRAVENOUS

## 2017-02-14 MED ORDER — ACETAMINOPHEN 650 MG RE SUPP: 650.0000 mg | RECTAL | Status: DC | PRN

## 2017-02-14 MED ORDER — DEXAMETHASONE SODIUM PHOSPHATE 10 MG/ML IJ SOLN
INTRAMUSCULAR | Status: AC
  Filled 2017-02-14: qty 1

## 2017-02-14 MED ORDER — CHLORHEXIDINE GLUCONATE CLOTH 2 % EX PADS: 6.0000 | MEDICATED_PAD | Freq: Once | CUTANEOUS | Status: DC

## 2017-02-14 MED ORDER — ACETAMINOPHEN 325 MG PO TABS: 650.0000 mg | ORAL_TABLET | ORAL | Status: DC | PRN

## 2017-02-14 MED ORDER — PROMETHAZINE HCL 25 MG/ML IJ SOLN: 6.2500 mg | INTRAMUSCULAR | Status: DC | PRN

## 2017-02-14 MED ORDER — MORPHINE SULFATE (PF) 2 MG/ML IV SOLN: 1.0000 mg | INTRAVENOUS | Status: DC | PRN

## 2017-02-14 MED ORDER — 0.9 % SODIUM CHLORIDE (POUR BTL) OPTIME
TOPICAL | Status: DC | PRN
  Administered 2017-02-14: 1000 mL

## 2017-02-14 MED ORDER — ONDANSETRON HCL 4 MG/2ML IJ SOLN
INTRAMUSCULAR | Status: AC
  Filled 2017-02-14: qty 2

## 2017-02-14 MED ORDER — PROPOFOL 10 MG/ML IV BOLUS
INTRAVENOUS | Status: DC | PRN
  Administered 2017-02-14: 120 mg via INTRAVENOUS

## 2017-02-14 MED ORDER — LIDOCAINE 2% (20 MG/ML) 5 ML SYRINGE
INTRAMUSCULAR | Status: AC
  Filled 2017-02-14: qty 5

## 2017-02-14 MED ORDER — OXYCODONE HCL 5 MG PO TABS: 5.0000 mg | ORAL_TABLET | ORAL | Status: DC | PRN

## 2017-02-14 MED ORDER — PHENYLEPHRINE HCL 10 MG/ML IJ SOLN
INTRAVENOUS | Status: DC | PRN
  Administered 2017-02-14: 100 ug/min via INTRAVENOUS

## 2017-02-14 SURGICAL SUPPLY — 42 items
ADH SKN CLS APL DERMABOND .7 (GAUZE/BANDAGES/DRESSINGS) ×1
APPLIER CLIP 9.375 MED OPEN (MISCELLANEOUS) ×3
APR CLP MED 9.3 20 MLT OPN (MISCELLANEOUS) ×1
BINDER BREAST LRG (GAUZE/BANDAGES/DRESSINGS) IMPLANT
BINDER BREAST XLRG (GAUZE/BANDAGES/DRESSINGS) IMPLANT
BLADE SURG 15 STRL LF DISP TIS (BLADE) ×1 IMPLANT
BLADE SURG 15 STRL SS (BLADE) ×3
CANISTER SUCT 3000ML PPV (MISCELLANEOUS) ×3 IMPLANT
CHLORAPREP W/TINT 26ML (MISCELLANEOUS) ×3 IMPLANT
CLIP APPLIE 9.375 MED OPEN (MISCELLANEOUS) ×1 IMPLANT
COVER PROBE W GEL 5X96 (DRAPES) ×3 IMPLANT
COVER SURGICAL LIGHT HANDLE (MISCELLANEOUS) ×3 IMPLANT
DERMABOND ADVANCED (GAUZE/BANDAGES/DRESSINGS) ×2
DERMABOND ADVANCED .7 DNX12 (GAUZE/BANDAGES/DRESSINGS) ×1 IMPLANT
DEVICE DUBIN SPECIMEN MAMMOGRA (MISCELLANEOUS) ×3 IMPLANT
DRAPE CHEST BREAST 15X10 FENES (DRAPES) ×3 IMPLANT
DRAPE UTILITY XL STRL (DRAPES) ×3 IMPLANT
ELECT CAUTERY BLADE 6.4 (BLADE) ×3 IMPLANT
ELECT REM PT RETURN 9FT ADLT (ELECTROSURGICAL) ×3
ELECTRODE REM PT RTRN 9FT ADLT (ELECTROSURGICAL) ×1 IMPLANT
GLOVE SURG SIGNA 7.5 PF LTX (GLOVE) ×3 IMPLANT
GOWN STRL REUS W/ TWL LRG LVL3 (GOWN DISPOSABLE) ×1 IMPLANT
GOWN STRL REUS W/ TWL XL LVL3 (GOWN DISPOSABLE) ×1 IMPLANT
GOWN STRL REUS W/TWL LRG LVL3 (GOWN DISPOSABLE) ×3
GOWN STRL REUS W/TWL XL LVL3 (GOWN DISPOSABLE) ×3
KIT BASIN OR (CUSTOM PROCEDURE TRAY) ×3 IMPLANT
KIT MARKER MARGIN INK (KITS) ×3 IMPLANT
NDL HYPO 25GX1X1/2 BEV (NEEDLE) ×1 IMPLANT
NEEDLE HYPO 25GX1X1/2 BEV (NEEDLE) ×3 IMPLANT
NS IRRIG 1000ML POUR BTL (IV SOLUTION) IMPLANT
PACK SURGICAL SETUP 50X90 (CUSTOM PROCEDURE TRAY) ×3 IMPLANT
PENCIL BUTTON HOLSTER BLD 10FT (ELECTRODE) ×3 IMPLANT
SPONGE LAP 18X18 X RAY DECT (DISPOSABLE) ×3 IMPLANT
SUT MNCRL AB 4-0 PS2 18 (SUTURE) ×3 IMPLANT
SUT VIC AB 3-0 SH 18 (SUTURE) ×3 IMPLANT
SYR BULB 3OZ (MISCELLANEOUS) ×3 IMPLANT
SYR CONTROL 10ML LL (SYRINGE) ×3 IMPLANT
TOWEL OR 17X24 6PK STRL BLUE (TOWEL DISPOSABLE) ×3 IMPLANT
TOWEL OR 17X26 10 PK STRL BLUE (TOWEL DISPOSABLE) ×3 IMPLANT
TUBE CONNECTING 12'X1/4 (SUCTIONS) ×1
TUBE CONNECTING 12X1/4 (SUCTIONS) ×2 IMPLANT
YANKAUER SUCT BULB TIP NO VENT (SUCTIONS) ×3 IMPLANT

## 2017-02-14 NOTE — Op Note (Signed)
LEFT BREAST LUMPECTOMY WITH RADIOACTIVE SEED LOCALIZATION  Procedure Note  Sophiana Milanese Kissinger 02/14/2017   Pre-op Diagnosis: LEFT BREAST CALCIFICATIONS     Post-op Diagnosis: same  Procedure(s): LEFT BREAST LUMPECTOMY WITH RADIOACTIVE SEED LOCALIZATION  Surgeon(s): Coralie Keens, MD  Anesthesia: General  Staff:  Circulator: Rosanne Sack, RN Scrub Person: Zannie Kehr Circulator Assistant: Pollie Meyer, RN  Estimated Blood Loss: Minimal               Specimens: sent to path  Indications: This is a 71 year old female with a prior history of right breast cancer who now has abnormal appearing calcifications in the left breast.  These were not amenable to stereotactic biopsy so a radioactive seed guided lumpectomy of the left breast is recommended  Findings: The lumpectomy specimen was x-rayed and the calcifications and radioactive seed were found to be in the specimen which was then sent to pathology  Residual: The patient was brought to the operating room and identified as the correct patient.  She was placed supine on the operating room table and general anesthesia was induced.  Her left breast was then prepped and draped in the usual sterile fashion.  The radioactive seed was located in the upper outer quadrant of the 12 o'clock position.  If I anesthetized the skin over the top of the seed with Marcaine.  I then made a small transverse incision on the upper breast.  I took this down to the breast tissue at the electrocautery.  With the aid of the neoprobe, I was able to perform a lumpectomy staying around the radioactive seed and going all the way down to the chest wall.  Once the specimen was removed, it was marked in all quadrants with paint.  X-ray was performed confirming that the seed and calcifications were in the specimen.  The specimen was then sent to pathology for evaluation.  Hemostasis was achieved with a cautery.  I placed 2 surgical clips into the biopsy  cavity.  I then closed the subtenons tissue with interrupted 3-0 Vicryl sutures and closed the skin with a running 4-0 Monocryl.  Dermabond was then applied.  The patient tolerated the procedure well.  All the counts were correct at the end of the procedure.  The patient was then extubated in the operating room and taken in a stable condition to the recovery room.          Chanelle Hodsdon A   Date: 02/14/2017  Time: 7:55 AM

## 2017-02-14 NOTE — Interval H&P Note (Signed)
History and Physical Interval Note:no change in H and P  02/14/2017 6:47 AM  Sara Weeks  has presented today for surgery, with the diagnosis of LEFT BREAST CALCIFICATIONS  The various methods of treatment have been discussed with the patient and family. After consideration of risks, benefits and other options for treatment, the patient has consented to  Procedure(s): LEFT BREAST LUMPECTOMY WITH RADIOACTIVE SEED LOCALIZATION (Left) as a surgical intervention .  The patient's history has been reviewed, patient examined, no change in status, stable for surgery.  I have reviewed the patient's chart and labs.  Questions were answered to the patient's satisfaction.     Jonta Gastineau A

## 2017-02-14 NOTE — Addendum Note (Signed)
Addendum  created 02/14/17 1457 by Lieutenant Diego, CRNA   Anesthesia Event edited

## 2017-02-14 NOTE — Anesthesia Postprocedure Evaluation (Signed)
Anesthesia Post Note  Patient: Sara Weeks  Procedure(s) Performed: LEFT BREAST LUMPECTOMY WITH RADIOACTIVE SEED LOCALIZATION (Left Breast)     Patient location during evaluation: PACU Anesthesia Type: General Level of consciousness: awake and alert Pain management: pain level controlled Vital Signs Assessment: post-procedure vital signs reviewed and stable Respiratory status: spontaneous breathing, nonlabored ventilation, respiratory function stable and patient connected to nasal cannula oxygen Cardiovascular status: blood pressure returned to baseline and stable Postop Assessment: no apparent nausea or vomiting Anesthetic complications: no    Last Vitals:  Vitals:   02/14/17 0814 02/14/17 0826  BP: 117/64 121/62  Pulse: 83 70  Resp: 16 15  Temp:  36.4 C  SpO2: 100% 98%    Last Pain: There were no vitals filed for this visit.               Tiajuana Amass

## 2017-02-14 NOTE — Anesthesia Procedure Notes (Signed)
Procedure Name: LMA Insertion Date/Time: 02/14/2017 7:29 AM Performed by: Lieutenant Diego Pre-anesthesia Checklist: Patient identified, Emergency Drugs available, Suction available and Patient being monitored Patient Re-evaluated:Patient Re-evaluated prior to induction Oxygen Delivery Method: Circle system utilized Preoxygenation: Pre-oxygenation with 100% oxygen Induction Type: IV induction Ventilation: Mask ventilation without difficulty LMA: LMA inserted LMA Size: 3.0 Number of attempts: 1 Placement Confirmation: positive ETCO2 and breath sounds checked- equal and bilateral Tube secured with: Tape Dental Injury: Teeth and Oropharynx as per pre-operative assessment

## 2017-02-14 NOTE — Discharge Instructions (Signed)
[facility] °Office Phone Number [phone] ° °BREAST BIOPSY/ PARTIAL MASTECTOMY: POST OP INSTRUCTIONS ° °Always review your discharge instruction sheet given to you by the facility where your surgery was performed. ° °IF YOU HAVE DISABILITY OR FAMILY LEAVE FORMS, YOU MUST BRING THEM TO THE OFFICE FOR PROCESSING.  DO NOT GIVE THEM TO YOUR DOCTOR. ° °1. A prescription for pain medication may be given to you upon discharge.  Take your pain medication as prescribed, if needed.  If narcotic pain medicine is not needed, then you may take acetaminophen (Tylenol) or ibuprofen (Advil) as needed. °2. Take your usually prescribed medications unless otherwise directed °3. If you need a refill on your pain medication, please contact your pharmacy.  They will contact our office to request authorization.  Prescriptions will not be filled after 5pm or on week-ends. °4. You should eat very light the first 24 hours after surgery, such as soup, crackers, pudding, etc.  Resume your normal diet the day after surgery. °5. Most patients will experience some swelling and bruising in the breast.  Ice packs and a good support bra will help.  Swelling and bruising can take several days to resolve.  °6. It is common to experience some constipation if taking pain medication after surgery.  Increasing fluid intake and taking a stool softener will usually help or prevent this problem from occurring.  A mild laxative (Milk of Magnesia or Miralax) should be taken according to package directions if there are no bowel movements after 48 hours. °7. Unless discharge instructions indicate otherwise, you may remove your bandages 24-48 hours after surgery, and you may shower at that time.  You may have steri-strips (small skin tapes) in place directly over the incision.  These strips should be left on the skin for 7-10 days.  If your surgeon used skin glue on the incision, you may shower in 24 hours.  The glue will flake off over the  next 2-3 weeks.  Any sutures or staples will be removed at the office during your follow-up visit. °8. ACTIVITIES:  You may resume regular daily activities (gradually increasing) beginning the next day.  Wearing a good support bra or sports bra minimizes pain and swelling.  You may have sexual intercourse when it is comfortable. °a. You may drive when you no longer are taking prescription pain medication, you can comfortably wear a seatbelt, and you can safely maneuver your car and apply brakes. °b. RETURN TO WORK:  ______________________________________________________________________________________ °9. You should see your doctor in the office for a follow-up appointment approximately two weeks after your surgery.  Your doctor’s nurse will typically make your follow-up appointment when she calls you with your pathology report.  Expect your pathology report 2-3 business days after your surgery.  You may call to check if you do not hear from us after three days. °10. OTHER INSTRUCTIONS: _OK TO SHOWER STARTING TOMORROW °11. ICE PACK, TYLENOL, IBUPROFEN ALSO FOR PAIN °12. ______________________________________________________________________________________________ _____________________________________________________________________________________________________________________________________ °_____________________________________________________________________________________________________________________________________ °_____________________________________________________________________________________________________________________________________ ° °WHEN TO CALL YOUR DOCTOR: °1. Fever over 101.0 °2. Nausea and/or vomiting. °3. Extreme swelling or bruising. °4. Continued bleeding from incision. °5. Increased pain, redness, or drainage from the incision. ° °The clinic staff is available to answer your questions during regular business hours.  Please don’t hesitate to call and ask to speak to one of the  nurses for clinical concerns.  If you have a medical emergency, go to the nearest emergency room or call 911.  A surgeon from [facility] is   always on call at the hospital. ° °For further questions, please visit [email or web address]  °

## 2017-02-14 NOTE — Transfer of Care (Signed)
Immediate Anesthesia Transfer of Care Note  Patient: Sara Weeks  Procedure(s) Performed: LEFT BREAST LUMPECTOMY WITH RADIOACTIVE SEED LOCALIZATION (Left Breast)  Patient Location: PACU  Anesthesia Type:General  Level of Consciousness: awake  Airway & Oxygen Therapy: Patient Spontanous Breathing and Patient connected to face mask oxygen  Post-op Assessment: Report given to RN and Post -op Vital signs reviewed and stable  Post vital signs: Reviewed and stable  Last Vitals:  Vitals:   02/14/17 0556 02/14/17 0606  BP: (!) 97/58   Pulse: (!) 53   Resp: 17   Temp: 36.8 C   SpO2:  100%    Last Pain: There were no vitals filed for this visit.    Patients Stated Pain Goal: 3 (97/74/14 2395)  Complications: No apparent anesthesia complications

## 2017-02-15 ENCOUNTER — Encounter (HOSPITAL_COMMUNITY): Payer: Self-pay | Admitting: Surgery

## 2017-04-15 ENCOUNTER — Emergency Department (HOSPITAL_COMMUNITY)
Admission: EM | Admit: 2017-04-15 | Discharge: 2017-04-15 | Disposition: A | Discharge status: Home / Self Care | Payer: Medicare Other | Attending: Emergency Medicine | Admitting: Emergency Medicine

## 2017-04-15 ENCOUNTER — Emergency Department (HOSPITAL_COMMUNITY): Payer: Medicare Other

## 2017-04-15 ENCOUNTER — Encounter (HOSPITAL_COMMUNITY): Payer: Self-pay | Admitting: Emergency Medicine

## 2017-04-15 DIAGNOSIS — Z7982 Long term (current) use of aspirin: Secondary | ICD-10-CM | POA: Insufficient documentation

## 2017-04-15 DIAGNOSIS — Z853 Personal history of malignant neoplasm of breast: Secondary | ICD-10-CM | POA: Insufficient documentation

## 2017-04-15 DIAGNOSIS — I10 Essential (primary) hypertension: Secondary | ICD-10-CM | POA: Diagnosis not present

## 2017-04-15 DIAGNOSIS — F17200 Nicotine dependence, unspecified, uncomplicated: Secondary | ICD-10-CM | POA: Insufficient documentation

## 2017-04-15 DIAGNOSIS — Z79899 Other long term (current) drug therapy: Secondary | ICD-10-CM | POA: Insufficient documentation

## 2017-04-15 DIAGNOSIS — R630 Anorexia: Secondary | ICD-10-CM | POA: Diagnosis not present

## 2017-04-15 DIAGNOSIS — Z7984 Long term (current) use of oral hypoglycemic drugs: Secondary | ICD-10-CM | POA: Diagnosis not present

## 2017-04-15 DIAGNOSIS — E119 Type 2 diabetes mellitus without complications: Secondary | ICD-10-CM | POA: Diagnosis not present

## 2017-04-15 DIAGNOSIS — R05 Cough: Secondary | ICD-10-CM | POA: Diagnosis not present

## 2017-04-15 DIAGNOSIS — R9431 Abnormal electrocardiogram [ECG] [EKG]: Secondary | ICD-10-CM | POA: Diagnosis not present

## 2017-04-15 LAB — COMPREHENSIVE METABOLIC PANEL
ALK PHOS: 60 U/L (ref 38–126)
ALT: 16 U/L (ref 14–54)
ANION GAP: 15 (ref 5–15)
AST: 43 U/L — ABNORMAL HIGH (ref 15–41)
Albumin: 4.5 g/dL (ref 3.5–5.0)
BUN: 22 mg/dL — ABNORMAL HIGH (ref 6–20)
CALCIUM: 9.4 mg/dL (ref 8.9–10.3)
CO2: 27 mmol/L (ref 22–32)
CREATININE: 1.06 mg/dL — AB (ref 0.44–1.00)
Chloride: 98 mmol/L — ABNORMAL LOW (ref 101–111)
GFR, EST AFRICAN AMERICAN: 60 mL/min — AB (ref >60)
GFR, EST NON AFRICAN AMERICAN: 52 mL/min — AB (ref >60)
Glucose, Bld: 76 mg/dL (ref 65–99)
Potassium: 3.9 mmol/L (ref 3.5–5.1)
Sodium: 140 mmol/L (ref 135–145)
TOTAL PROTEIN: 7.5 g/dL (ref 6.5–8.1)
Total Bilirubin: 0.4 mg/dL (ref 0.3–1.2)

## 2017-04-15 LAB — URINALYSIS, ROUTINE W REFLEX MICROSCOPIC
Bilirubin Urine: NEGATIVE
GLUCOSE, UA: NEGATIVE mg/dL
Hgb urine dipstick: NEGATIVE
Ketones, ur: 20 mg/dL — AB
NITRITE: NEGATIVE
PH: 5 (ref 5.0–8.0)
PROTEIN: NEGATIVE mg/dL
SPECIFIC GRAVITY, URINE: 1.012 (ref 1.005–1.030)

## 2017-04-15 LAB — CBC WITH DIFFERENTIAL/PLATELET
Basophils Absolute: 0 10*3/uL (ref 0.0–0.1)
Basophils Relative: 0 %
EOS ABS: 0 10*3/uL (ref 0.0–0.7)
EOS PCT: 0 %
HCT: 38.7 % (ref 36.0–46.0)
HEMOGLOBIN: 13 g/dL (ref 12.0–15.0)
LYMPHS ABS: 2.8 10*3/uL (ref 0.7–4.0)
LYMPHS PCT: 42 %
MCH: 32.7 pg (ref 26.0–34.0)
MCHC: 33.6 g/dL (ref 30.0–36.0)
MCV: 97.5 fL (ref 78.0–100.0)
MONOS PCT: 3 %
Monocytes Absolute: 0.2 10*3/uL (ref 0.1–1.0)
NEUTROS PCT: 55 %
Neutro Abs: 3.7 10*3/uL (ref 1.7–7.7)
Platelets: 288 10*3/uL (ref 150–400)
RBC: 3.97 MIL/uL (ref 3.87–5.11)
RDW: 13.1 % (ref 11.5–15.5)
WBC: 6.8 10*3/uL (ref 4.0–10.5)

## 2017-04-15 LAB — CBG MONITORING, ED: Glucose-Capillary: 77 mg/dL (ref 65–99)

## 2017-04-15 LAB — TSH: TSH: 0.354 u[IU]/mL (ref 0.350–4.500)

## 2017-04-15 LAB — MAGNESIUM: Magnesium: 2.3 mg/dL (ref 1.7–2.4)

## 2017-04-15 MED ORDER — SODIUM CHLORIDE 0.9 % IV BOLUS (SEPSIS)
1000.0000 mL | Freq: Once | INTRAVENOUS | Status: AC
  Administered 2017-04-15: 1000 mL via INTRAVENOUS

## 2017-04-15 MED ORDER — ONDANSETRON HCL 4 MG/2ML IJ SOLN
4.0000 mg | Freq: Once | INTRAMUSCULAR | Status: AC
  Administered 2017-04-15: 4 mg via INTRAVENOUS
  Filled 2017-04-15: qty 2

## 2017-04-15 NOTE — ED Triage Notes (Signed)
Patient reports for 4 days hasnt had an appetite so hasnt been eating. Denies any n/v/d, constipation or urinary problems or pain.  Reports that she is diabetic and been checking sugars that are normal for her. Has appt with PCP on Thursday.

## 2017-04-15 NOTE — ED Notes (Signed)
Patient transported to X-ray 

## 2017-04-15 NOTE — Discharge Instructions (Addendum)
Your blood work, urine, chest x-ray was reassuring.  Please follow-up with PCP on Thursday as scheduled  Return without fail for worsening symptoms, including fever, confusion, inability to walk, or any other symptoms concerning to you

## 2017-04-15 NOTE — ED Notes (Signed)
ED Provider at bedside. 
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
## 2017-04-17 DIAGNOSIS — R634 Abnormal weight loss: Secondary | ICD-10-CM | POA: Diagnosis not present

## 2017-04-17 DIAGNOSIS — K580 Irritable bowel syndrome with diarrhea: Secondary | ICD-10-CM | POA: Diagnosis not present

## 2017-04-17 DIAGNOSIS — F322 Major depressive disorder, single episode, severe without psychotic features: Secondary | ICD-10-CM | POA: Diagnosis not present

## 2017-04-17 LAB — URINE CULTURE

## 2017-04-24 DIAGNOSIS — R634 Abnormal weight loss: Secondary | ICD-10-CM | POA: Diagnosis not present

## 2017-04-24 DIAGNOSIS — F332 Major depressive disorder, recurrent severe without psychotic features: Secondary | ICD-10-CM | POA: Diagnosis not present

## 2017-04-24 DIAGNOSIS — F419 Anxiety disorder, unspecified: Secondary | ICD-10-CM | POA: Diagnosis not present

## 2017-05-01 DIAGNOSIS — E1165 Type 2 diabetes mellitus with hyperglycemia: Secondary | ICD-10-CM | POA: Diagnosis not present

## 2017-05-01 DIAGNOSIS — R634 Abnormal weight loss: Secondary | ICD-10-CM | POA: Diagnosis not present

## 2017-05-01 DIAGNOSIS — H6992 Unspecified Eustachian tube disorder, left ear: Secondary | ICD-10-CM | POA: Diagnosis not present

## 2017-05-01 DIAGNOSIS — F419 Anxiety disorder, unspecified: Secondary | ICD-10-CM | POA: Diagnosis not present

## 2017-05-22 DIAGNOSIS — R159 Full incontinence of feces: Secondary | ICD-10-CM | POA: Diagnosis not present

## 2017-05-22 DIAGNOSIS — R634 Abnormal weight loss: Secondary | ICD-10-CM | POA: Diagnosis not present

## 2017-06-03 ENCOUNTER — Encounter: Payer: Self-pay | Admitting: Gastroenterology

## 2017-06-05 ENCOUNTER — Telehealth: Payer: Self-pay | Admitting: Gastroenterology

## 2017-06-05 NOTE — Telephone Encounter (Signed)
Rec'd from [place] forwarded 18 pages to Dr. Harl Bowie

## 2017-07-11 ENCOUNTER — Ambulatory Visit (INDEPENDENT_AMBULATORY_CARE_PROVIDER_SITE_OTHER): Payer: Medicare Other | Admitting: Podiatry

## 2017-07-11 ENCOUNTER — Encounter: Payer: Self-pay | Admitting: Podiatry

## 2017-07-11 DIAGNOSIS — Q828 Other specified congenital malformations of skin: Secondary | ICD-10-CM | POA: Diagnosis not present

## 2017-07-11 DIAGNOSIS — E119 Type 2 diabetes mellitus without complications: Secondary | ICD-10-CM | POA: Diagnosis not present

## 2017-07-11 DIAGNOSIS — M2042 Other hammer toe(s) (acquired), left foot: Secondary | ICD-10-CM

## 2017-07-11 DIAGNOSIS — M2041 Other hammer toe(s) (acquired), right foot: Secondary | ICD-10-CM

## 2017-07-11 NOTE — Progress Notes (Signed)
This patient presents to  the office stating that she is experiencing pain and burning through the corns on the second and fourth toes left foot.  She says this is been going on for months, but it is worsening.  She says these areas are painful and burning walking and wearing her shoes.  Patient also has pain and discomfort from a callus noted under the outside ball of her left foot. She says that she has had surgery and has not been able to return to the office for treatment.  She presents the office today for an evaluation and treatment of these painful callused  lesion   General Appearance  Alert, conversant and in no acute stress.  Vascular  Dorsalis pedis and posterior tibial  pulses are palpable  bilaterally.  Capillary return is within normal limits  bilaterally. Temperature is within normal limits  bilaterally.  Neurologic  Senn-Weinstein monofilament wire test within normal limits  bilaterally. Muscle power within normal limits bilaterally.  Nails normal nails noted with no evidence of fungal or bacterial infections.  Orthopedic  No limitations of motion of motion feet .  No crepitus or effusions noted.  Hammer toes 2-4  B/L  Skin  normotropic skin with no  noted bilaterally.  No signs of infections or ulcers noted.  Heloma durum 2,4 left toe.  Porokeratosis sub 3,4 left foot.  Porokeratosis left foot  Heloma durum 2,4 digits left foot.   Gardiner Barefoot DPM

## 2017-07-24 ENCOUNTER — Ambulatory Visit (INDEPENDENT_AMBULATORY_CARE_PROVIDER_SITE_OTHER): Payer: Medicare Other | Admitting: Gastroenterology

## 2017-07-24 ENCOUNTER — Other Ambulatory Visit: Payer: Medicare Other

## 2017-07-24 ENCOUNTER — Ambulatory Visit (INDEPENDENT_AMBULATORY_CARE_PROVIDER_SITE_OTHER)
Admission: RE | Admit: 2017-07-24 | Discharge: 2017-07-24 | Disposition: A | Discharge status: Home / Self Care | Payer: Medicare Other | Source: Ambulatory Visit | Attending: Gastroenterology | Admitting: Gastroenterology

## 2017-07-24 ENCOUNTER — Encounter: Payer: Self-pay | Admitting: Gastroenterology

## 2017-07-24 VITALS — BP 104/62 | HR 76 | Ht 60.0 in | Wt 83.8 lb

## 2017-07-24 DIAGNOSIS — R1084 Generalized abdominal pain: Secondary | ICD-10-CM

## 2017-07-24 DIAGNOSIS — R197 Diarrhea, unspecified: Secondary | ICD-10-CM

## 2017-07-24 DIAGNOSIS — R634 Abnormal weight loss: Secondary | ICD-10-CM

## 2017-07-24 DIAGNOSIS — R159 Full incontinence of feces: Secondary | ICD-10-CM | POA: Diagnosis not present

## 2017-07-24 DIAGNOSIS — K861 Other chronic pancreatitis: Secondary | ICD-10-CM

## 2017-07-24 DIAGNOSIS — R1032 Left lower quadrant pain: Secondary | ICD-10-CM | POA: Diagnosis not present

## 2017-07-24 MED ORDER — COLESTIPOL HCL 1 G PO TABS: 1.0000 g | ORAL_TABLET | Freq: Two times a day (BID) | ORAL | 2 refills | Status: AC

## 2017-07-24 MED ORDER — PANCRELIPASE (LIP-PROT-AMYL) 36000-114000 UNITS PO CPEP: ORAL_CAPSULE | ORAL | 2 refills | Status: AC

## 2017-07-24 NOTE — Patient Instructions (Addendum)
You have been scheduled to have an anorectal manometry at [facility] on 07/30/17 at 12;30pm. Please arrive 30 minutes prior to your appointment time for registration (1st floor of the hospital-admissions).  Please make certain to use 1 Fleets enema 2 hours prior to coming for your appointment. You can purchase Fleets enemas from the laxative section at your drug store. You should not eat anything during the two hours prior to the procedure. You may take regular medications with small sips of water at least 2 hours prior to the study.  Anorectal manometry is a test performed to evaluate patients with constipation or fecal incontinence. This test measures the pressures of the anal sphincter muscles, the sensation in the rectum, and the neural reflexes that are needed for normal bowel movements.  THE PROCEDURE The test takes approximately 30 minutes to 1 hour. You will be asked to change into a hospital gown. A technician or nurse will explain the procedure to you, take a brief health history, and answer any questions you may have. The patient then lies on his or her left side. A small, flexible tube, about the size of a thermometer, with a balloon at the end is inserted into the rectum. The catheter is connected to a machine that measures the pressure. During the test, the small balloon attached to the catheter may be inflated in the rectum to assess the normal reflex pathways. The nurse or technician may also ask the person to squeeze, relax, and push at various times. The anal sphincter muscle pressures are measured during each of these maneuvers. To squeeze, the patient tightens the sphincter muscles as if trying to prevent anything from coming out. To push or bear down, the patient strains down as if trying to have a bowel movement.  Normal BMI (Body Mass Index- based on height and weight) is between 23 and 30. Your BMI today is Body mass index is 16.37 kg/m. Marland Kitchen Please consider follow up   regarding your BMI with your Primary Care Provider.  Your provider has requested that you go to the basement level for lab work before leaving today. Press "B" on the elevator. The lab is located at the first door on the left as you exit the elevator.  Go down to the basment for x-rays and labs  please take Benifiber 1 teaspoon 2-3 times daily
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
## 2017-07-30 ENCOUNTER — Encounter (HOSPITAL_COMMUNITY)
Admission: RE | Disposition: A | Discharge status: Home / Self Care | Payer: Self-pay | Source: Ambulatory Visit | Attending: Gastroenterology

## 2017-07-30 ENCOUNTER — Ambulatory Visit (HOSPITAL_COMMUNITY)
Admission: RE | Admit: 2017-07-30 | Discharge: 2017-07-30 | Disposition: A | Discharge status: Home / Self Care | Payer: Medicare Other | Source: Ambulatory Visit | Attending: Gastroenterology | Admitting: Gastroenterology

## 2017-07-30 ENCOUNTER — Other Ambulatory Visit: Payer: Medicare Other

## 2017-07-30 ENCOUNTER — Encounter (HOSPITAL_COMMUNITY): Payer: Self-pay

## 2017-07-30 DIAGNOSIS — R159 Full incontinence of feces: Secondary | ICD-10-CM

## 2017-07-30 DIAGNOSIS — K861 Other chronic pancreatitis: Secondary | ICD-10-CM | POA: Insufficient documentation

## 2017-07-30 DIAGNOSIS — R109 Unspecified abdominal pain: Secondary | ICD-10-CM | POA: Insufficient documentation

## 2017-07-30 DIAGNOSIS — R197 Diarrhea, unspecified: Secondary | ICD-10-CM

## 2017-07-30 DIAGNOSIS — Z853 Personal history of malignant neoplasm of breast: Secondary | ICD-10-CM | POA: Diagnosis not present

## 2017-07-30 DIAGNOSIS — R1084 Generalized abdominal pain: Secondary | ICD-10-CM | POA: Diagnosis not present

## 2017-07-30 HISTORY — PX: ANAL RECTAL MANOMETRY: SHX6358

## 2017-07-30 SURGERY — MANOMETRY, ANORECTAL

## 2017-07-30 NOTE — Progress Notes (Signed)
Ano Rectal manometry performed per protocol.  When performing the rairs exam, balloon met resistance and was unable to inflate properly.  Probe was positioned further back and the balloon was able to inflate without resistance.  Some mucous like discharge was noted on removal of the probe.  Patient tolerated well.  Patient was able to expel balloon after 2 seconds of pushing. Results sent to Dr. Silverio Decamp.

## 2017-07-31 ENCOUNTER — Encounter (HOSPITAL_COMMUNITY): Payer: Self-pay | Admitting: Gastroenterology

## 2017-08-04 LAB — PANCREATIC ELASTASE, FECAL: Pancreatic Elastase, Fecal: <50 ug Elast./g — ABNORMAL LOW (ref >200)

## 2017-08-04 LAB — FECAL FAT, QUALITATIVE
Fat Qual Neutral, Stl: INCREASED
Fat Qual Total, Stl: INCREASED

## 2017-08-06 ENCOUNTER — Other Ambulatory Visit: Payer: Self-pay

## 2017-08-07 DIAGNOSIS — R159 Full incontinence of feces: Secondary | ICD-10-CM

## 2017-08-12 ENCOUNTER — Telehealth: Payer: Self-pay

## 2017-08-12 NOTE — Telephone Encounter (Signed)
-----   Message from Mauri Pole, MD sent at 08/12/2017 11:12 AM EDT ----- Can we get her the coupon for Creon and try through [employer] and see if she qualify for any discounts.

## 2017-08-12 NOTE — Telephone Encounter (Signed)
Ok thank you 

## 2017-08-12 NOTE — Telephone Encounter (Signed)
Sara Weeks has a patient assistance program for Creon. She qualifies even with Medicare. I contacted the patient. Explained the process. She expresses interest. She will call [phone]. Sara Weeks will screen her and ask about her income. Patient given my name. She will call me with follow up.

## 2017-08-14 DIAGNOSIS — K861 Other chronic pancreatitis: Secondary | ICD-10-CM | POA: Diagnosis not present

## 2017-08-14 DIAGNOSIS — E7800 Pure hypercholesterolemia, unspecified: Secondary | ICD-10-CM | POA: Diagnosis not present

## 2017-08-14 DIAGNOSIS — F419 Anxiety disorder, unspecified: Secondary | ICD-10-CM | POA: Diagnosis not present

## 2017-08-14 DIAGNOSIS — E1165 Type 2 diabetes mellitus with hyperglycemia: Secondary | ICD-10-CM | POA: Diagnosis not present

## 2017-08-14 DIAGNOSIS — I10 Essential (primary) hypertension: Secondary | ICD-10-CM | POA: Diagnosis not present

## 2017-08-14 DIAGNOSIS — F322 Major depressive disorder, single episode, severe without psychotic features: Secondary | ICD-10-CM | POA: Diagnosis not present

## 2017-08-14 DIAGNOSIS — F17210 Nicotine dependence, cigarettes, uncomplicated: Secondary | ICD-10-CM | POA: Diagnosis not present

## 2017-10-30 DIAGNOSIS — E7800 Pure hypercholesterolemia, unspecified: Secondary | ICD-10-CM | POA: Diagnosis not present

## 2017-10-30 DIAGNOSIS — E46 Unspecified protein-calorie malnutrition: Secondary | ICD-10-CM | POA: Diagnosis not present

## 2017-10-30 DIAGNOSIS — I10 Essential (primary) hypertension: Secondary | ICD-10-CM | POA: Diagnosis not present

## 2017-10-30 DIAGNOSIS — F419 Anxiety disorder, unspecified: Secondary | ICD-10-CM | POA: Diagnosis not present

## 2017-10-30 DIAGNOSIS — F332 Major depressive disorder, recurrent severe without psychotic features: Secondary | ICD-10-CM | POA: Diagnosis not present

## 2017-10-30 DIAGNOSIS — E1122 Type 2 diabetes mellitus with diabetic chronic kidney disease: Secondary | ICD-10-CM | POA: Diagnosis not present

## 2017-10-30 DIAGNOSIS — E1165 Type 2 diabetes mellitus with hyperglycemia: Secondary | ICD-10-CM | POA: Diagnosis not present

## 2017-10-30 DIAGNOSIS — F17210 Nicotine dependence, cigarettes, uncomplicated: Secondary | ICD-10-CM | POA: Diagnosis not present

## 2017-10-30 DIAGNOSIS — Z0000 Encounter for general adult medical examination without abnormal findings: Secondary | ICD-10-CM | POA: Diagnosis not present

## 2017-10-30 DIAGNOSIS — M8580 Other specified disorders of bone density and structure, unspecified site: Secondary | ICD-10-CM | POA: Diagnosis not present

## 2017-11-09 IMAGING — MG 2D DIGITAL SCREENING BILATERAL MAMMOGRAM WITH CAD AND ADJUNCT TO
9 of 13 series · 9 of 29 positions shown · non-contrast
Comparison: Previous exam(s).

CLINICAL DATA: Screening.

EXAM:
2D DIGITAL SCREENING BILATERAL MAMMOGRAM WITH CAD AND ADJUNCT TOMO

[R MLO (1 of 2)]
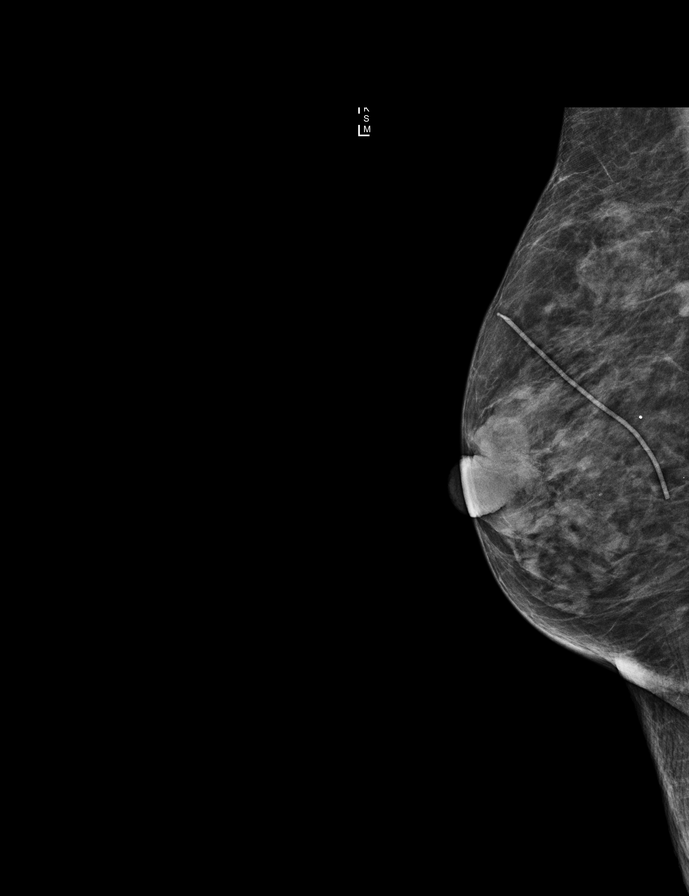

[L CC]
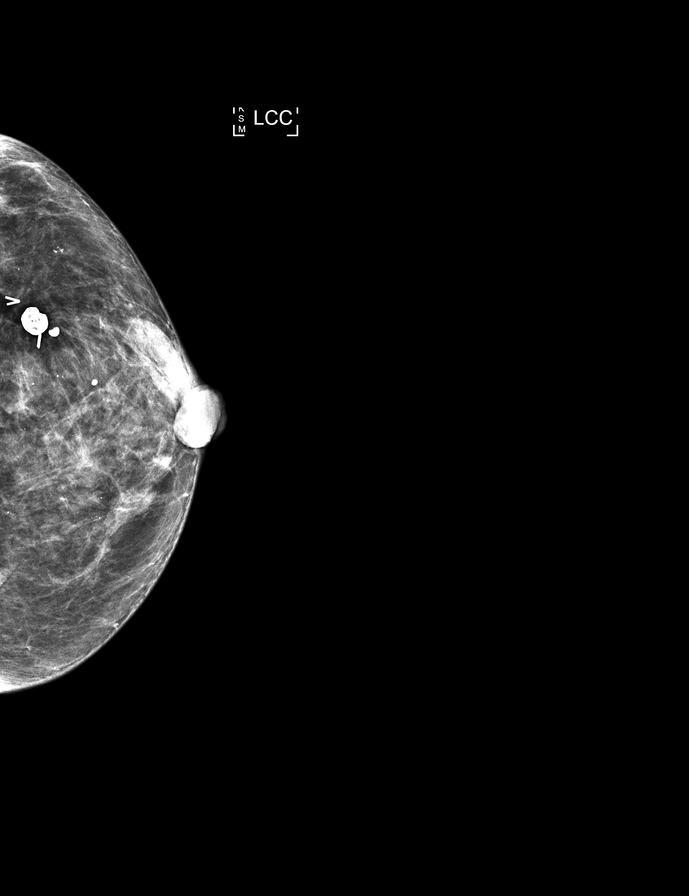

[R MLO synth-2D]
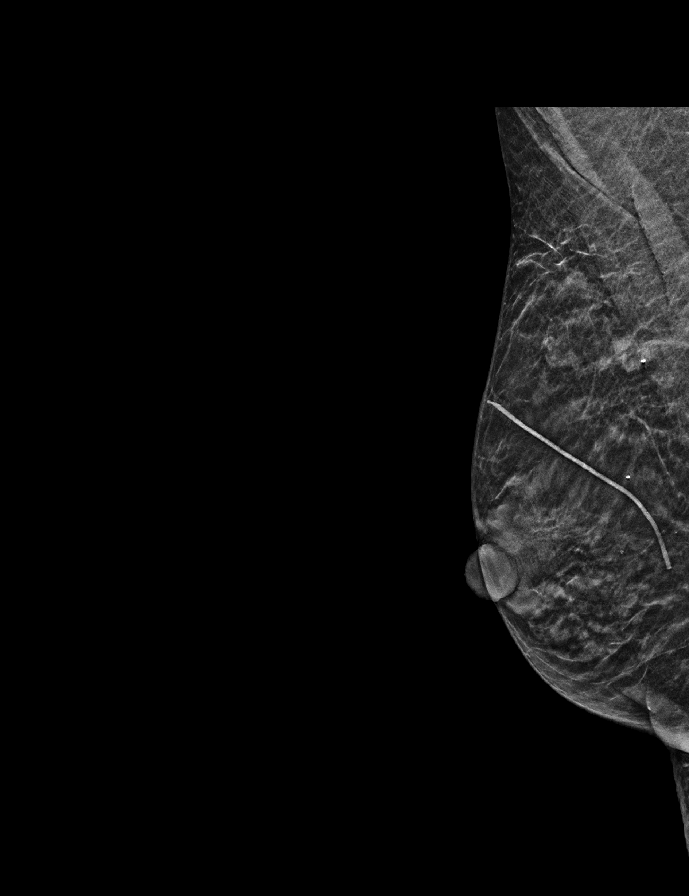

[L MLO]
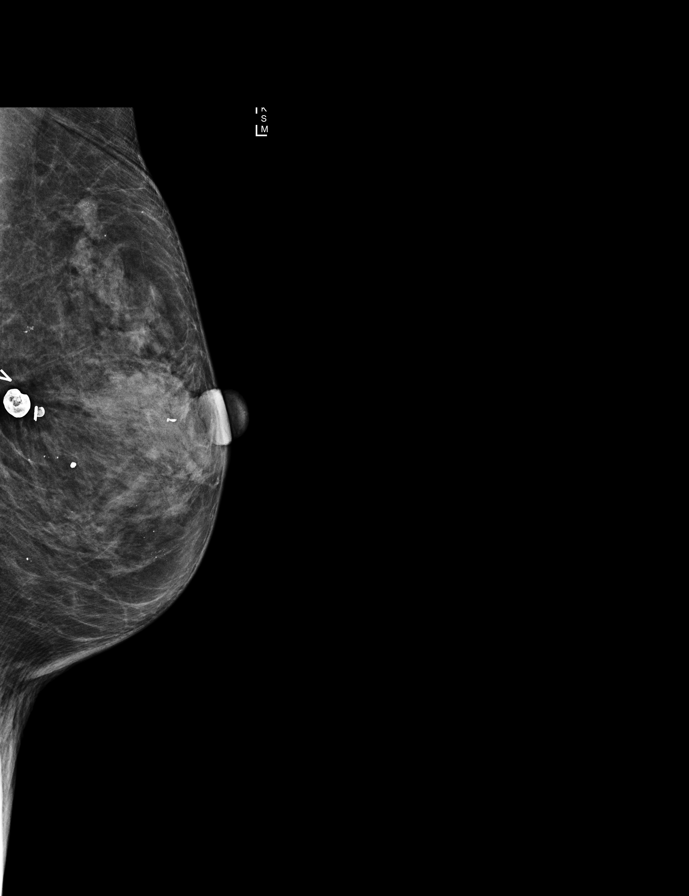

[R CC]
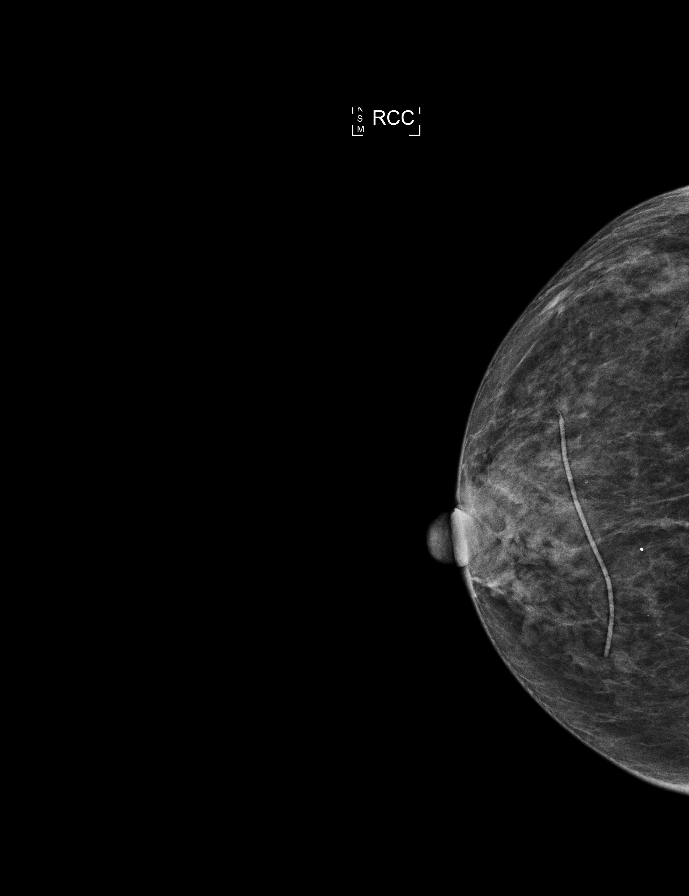

[L CC synth-2D]
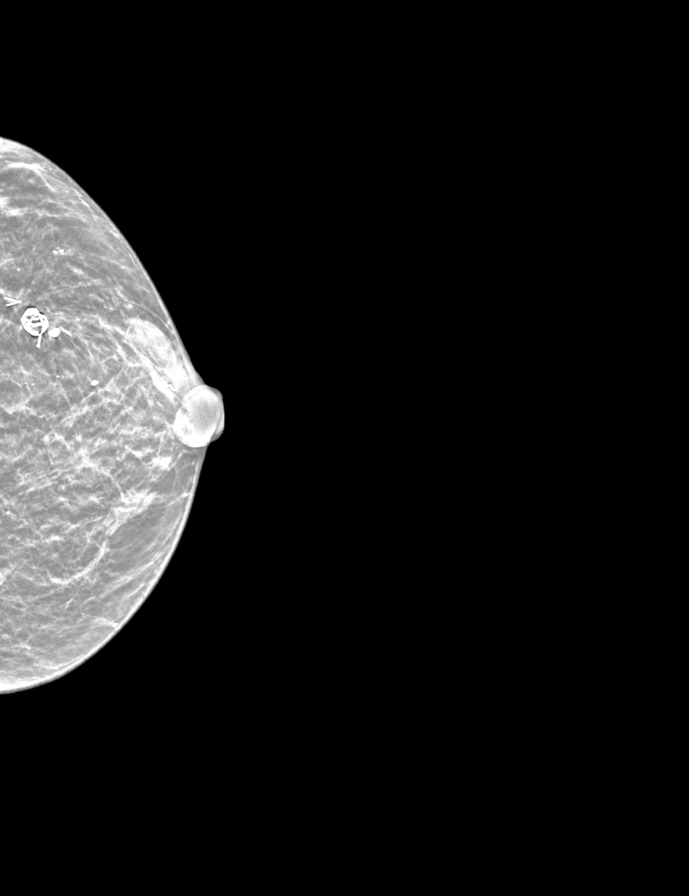

[L MLO synth-2D]
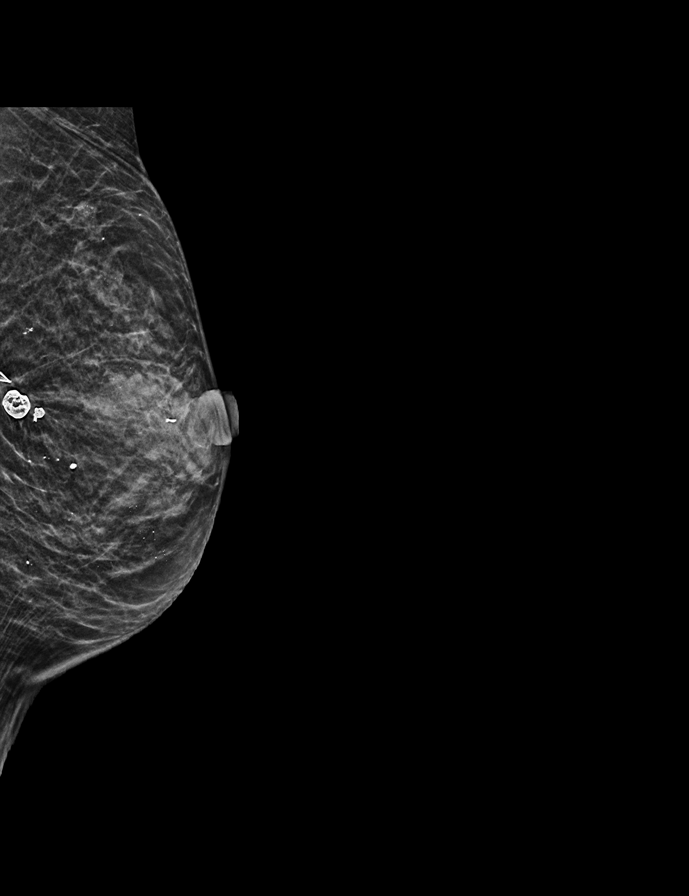

[R CC synth-2D]
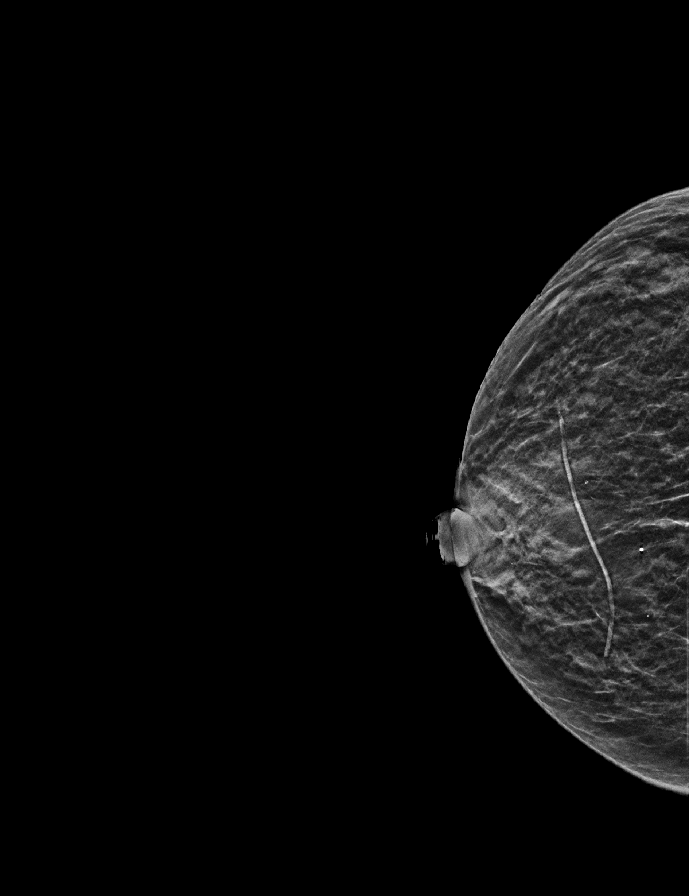

[R MLO (2 of 2)]
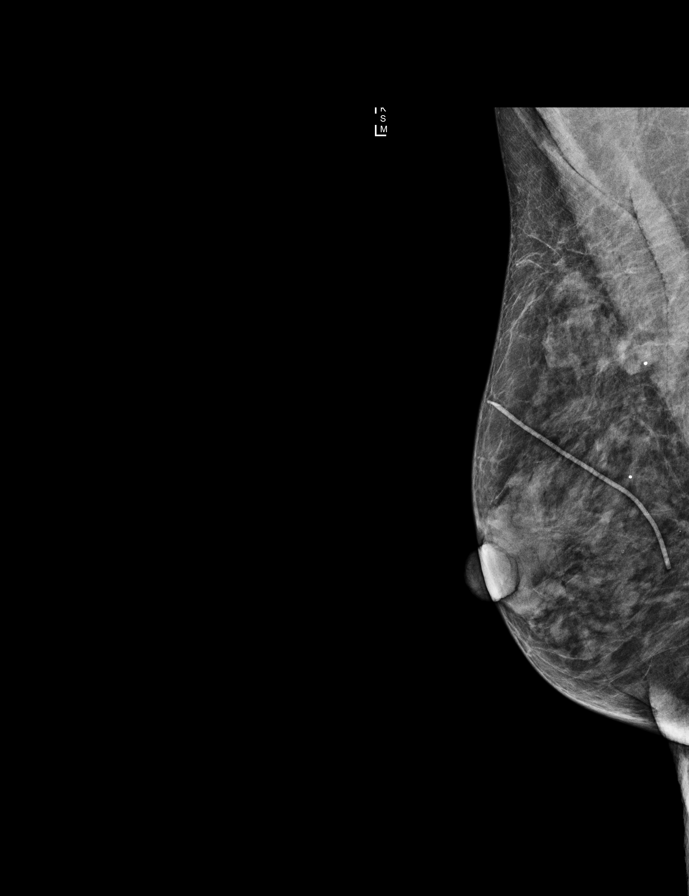

[9 of 29 positions shown; findings below may reference images not displayed]

ACR Breast Density Category c: The breast tissue is heterogeneously
dense, which may obscure small masses.
FINDINGS: In the left breast, a possible asymmetry with associated
microcalcifications warrants further evaluation. In the right
breast, no findings suspicious for malignancy. Images were processed
with CAD.
IMPRESSION: Further evaluation is suggested for possible asymmetry with
associated microcalcifications in the left breast.

RECOMMENDATION:
Diagnostic mammogram and possibly ultrasound of the left breast.
(Code:[id number])

The patient will be contacted regarding the findings, and additional
imaging will be scheduled.

BI-RADS CATEGORY  0: Incomplete. Need additional imaging evaluation
and/or prior mammograms for comparison.

## 2017-12-16 DIAGNOSIS — M8588 Other specified disorders of bone density and structure, other site: Secondary | ICD-10-CM | POA: Diagnosis not present

## 2018-02-12 DIAGNOSIS — E1165 Type 2 diabetes mellitus with hyperglycemia: Secondary | ICD-10-CM | POA: Diagnosis not present

## 2018-02-12 DIAGNOSIS — Z23 Encounter for immunization: Secondary | ICD-10-CM | POA: Diagnosis not present

## 2018-02-12 DIAGNOSIS — K580 Irritable bowel syndrome with diarrhea: Secondary | ICD-10-CM | POA: Diagnosis not present

## 2018-02-12 DIAGNOSIS — F17210 Nicotine dependence, cigarettes, uncomplicated: Secondary | ICD-10-CM | POA: Diagnosis not present

## 2018-02-12 DIAGNOSIS — R490 Dysphonia: Secondary | ICD-10-CM | POA: Diagnosis not present

## 2018-02-12 DIAGNOSIS — I10 Essential (primary) hypertension: Secondary | ICD-10-CM | POA: Diagnosis not present

## 2018-02-12 DIAGNOSIS — F332 Major depressive disorder, recurrent severe without psychotic features: Secondary | ICD-10-CM | POA: Diagnosis not present

## 2018-02-12 DIAGNOSIS — E46 Unspecified protein-calorie malnutrition: Secondary | ICD-10-CM | POA: Diagnosis not present

## 2018-03-04 ENCOUNTER — Encounter: Payer: Self-pay | Admitting: Podiatry

## 2018-03-04 ENCOUNTER — Ambulatory Visit (INDEPENDENT_AMBULATORY_CARE_PROVIDER_SITE_OTHER): Payer: Medicare Other | Admitting: Podiatry

## 2018-03-04 DIAGNOSIS — M2042 Other hammer toe(s) (acquired), left foot: Secondary | ICD-10-CM

## 2018-03-04 DIAGNOSIS — L84 Corns and callosities: Secondary | ICD-10-CM | POA: Diagnosis not present

## 2018-03-04 DIAGNOSIS — M779 Enthesopathy, unspecified: Secondary | ICD-10-CM

## 2018-03-04 DIAGNOSIS — Q828 Other specified congenital malformations of skin: Secondary | ICD-10-CM | POA: Diagnosis not present

## 2018-03-04 DIAGNOSIS — M2041 Other hammer toe(s) (acquired), right foot: Secondary | ICD-10-CM

## 2018-03-04 NOTE — Progress Notes (Signed)
This patient presents to  the office stating that she is experiencing pain and burning through the corns on the second and fourth toes left foot.  She says this is been going on for months, but it is worsening.  She says these areas are painful and burning walking and wearing her shoes.  Patient also has pain and discomfort from a callus noted under the outside ball of her left foot. .  She presents the office today for an evaluation and treatment of these painful callused  Lesions.  She also desires to discuss surgery for her hammer toes.  General Appearance  Alert, conversant and in no acute stress.  Vascular  Dorsalis pedis and posterior tibial  pulses are palpable  bilaterally.  Capillary return is within normal limits  bilaterally. Temperature is within normal limits  bilaterally.  Neurologic  Senn-Weinstein monofilament wire test within normal limits  bilaterally. Muscle power within normal limits bilaterally.  Nails normal nails noted with no evidence of fungal or bacterial infections.  Orthopedic  No limitations of motion of motion feet .  No crepitus or effusions noted.  Hammer toes 2-4  B/L  Skin  normotropic skin with no  noted bilaterally.  No signs of infections or ulcers noted.  [place] durum 2,4 left toe.  Porokeratosis sub 3,4 left foot.  Porokeratosis left foot  [place] durum 2,4 digits left foot.  ROV.  Debride callus.  Told her to make an appointment with Dr. March Rummage for surgical consultation.  Injection therapy.  Injection therapy using 1.0 cc. Of 2% xylocaine( 20 mg.) plus 1 cc. of kenalog-la ( 10 mg) plus 1/2 cc. of dexamethazone phosphate ( 2 mg)   Gardiner Barefoot DPM

## 2018-03-27 ENCOUNTER — Encounter: Payer: Self-pay | Admitting: Podiatry

## 2018-03-27 ENCOUNTER — Ambulatory Visit (INDEPENDENT_AMBULATORY_CARE_PROVIDER_SITE_OTHER): Payer: Medicare Other

## 2018-03-27 ENCOUNTER — Ambulatory Visit (INDEPENDENT_AMBULATORY_CARE_PROVIDER_SITE_OTHER): Payer: Medicare Other | Admitting: Podiatry

## 2018-03-27 DIAGNOSIS — M2041 Other hammer toe(s) (acquired), right foot: Secondary | ICD-10-CM

## 2018-03-27 DIAGNOSIS — M2042 Other hammer toe(s) (acquired), left foot: Secondary | ICD-10-CM

## 2018-03-27 DIAGNOSIS — M205X2 Other deformities of toe(s) (acquired), left foot: Secondary | ICD-10-CM

## 2018-03-27 DIAGNOSIS — M79673 Pain in unspecified foot: Secondary | ICD-10-CM | POA: Diagnosis not present

## 2018-04-08 DIAGNOSIS — Z96611 Presence of right artificial shoulder joint: Secondary | ICD-10-CM | POA: Diagnosis not present

## 2018-04-08 DIAGNOSIS — G5621 Lesion of ulnar nerve, right upper limb: Secondary | ICD-10-CM | POA: Diagnosis not present

## 2018-04-08 DIAGNOSIS — Z471 Aftercare following joint replacement surgery: Secondary | ICD-10-CM | POA: Diagnosis not present

## 2018-05-08 ENCOUNTER — Ambulatory Visit: Payer: Medicare Other | Admitting: Podiatry
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
## 2018-05-19 DIAGNOSIS — F330 Major depressive disorder, recurrent, mild: Secondary | ICD-10-CM | POA: Diagnosis not present

## 2018-05-19 DIAGNOSIS — E1165 Type 2 diabetes mellitus with hyperglycemia: Secondary | ICD-10-CM | POA: Diagnosis not present

## 2018-05-19 DIAGNOSIS — D649 Anemia, unspecified: Secondary | ICD-10-CM | POA: Diagnosis not present

## 2018-05-19 DIAGNOSIS — I10 Essential (primary) hypertension: Secondary | ICD-10-CM | POA: Diagnosis not present

## 2018-05-19 DIAGNOSIS — E1122 Type 2 diabetes mellitus with diabetic chronic kidney disease: Secondary | ICD-10-CM | POA: Diagnosis not present

## 2018-05-19 DIAGNOSIS — Z1159 Encounter for screening for other viral diseases: Secondary | ICD-10-CM | POA: Diagnosis not present

## 2018-11-17 DIAGNOSIS — Z79899 Other long term (current) drug therapy: Secondary | ICD-10-CM | POA: Diagnosis not present

## 2018-11-17 DIAGNOSIS — E46 Unspecified protein-calorie malnutrition: Secondary | ICD-10-CM | POA: Diagnosis not present

## 2018-11-17 DIAGNOSIS — K861 Other chronic pancreatitis: Secondary | ICD-10-CM | POA: Diagnosis not present

## 2018-11-17 DIAGNOSIS — Z0000 Encounter for general adult medical examination without abnormal findings: Secondary | ICD-10-CM | POA: Diagnosis not present

## 2018-11-17 DIAGNOSIS — E559 Vitamin D deficiency, unspecified: Secondary | ICD-10-CM | POA: Diagnosis not present

## 2018-11-17 DIAGNOSIS — E1165 Type 2 diabetes mellitus with hyperglycemia: Secondary | ICD-10-CM | POA: Diagnosis not present

## 2018-11-17 DIAGNOSIS — E1122 Type 2 diabetes mellitus with diabetic chronic kidney disease: Secondary | ICD-10-CM | POA: Diagnosis not present

## 2018-11-17 DIAGNOSIS — Z7984 Long term (current) use of oral hypoglycemic drugs: Secondary | ICD-10-CM | POA: Diagnosis not present

## 2018-11-17 DIAGNOSIS — K580 Irritable bowel syndrome with diarrhea: Secondary | ICD-10-CM | POA: Diagnosis not present

## 2018-11-17 DIAGNOSIS — I10 Essential (primary) hypertension: Secondary | ICD-10-CM | POA: Diagnosis not present

## 2018-11-17 DIAGNOSIS — E7800 Pure hypercholesterolemia, unspecified: Secondary | ICD-10-CM | POA: Diagnosis not present

## 2018-11-17 DIAGNOSIS — F330 Major depressive disorder, recurrent, mild: Secondary | ICD-10-CM | POA: Diagnosis not present

## 2019-01-27 ENCOUNTER — Other Ambulatory Visit: Payer: Self-pay | Admitting: Family Medicine

## 2019-01-27 DIAGNOSIS — Z1231 Encounter for screening mammogram for malignant neoplasm of breast: Secondary | ICD-10-CM

## 2019-01-28 ENCOUNTER — Ambulatory Visit
Admission: RE | Admit: 2019-01-28 | Discharge: 2019-01-28 | Disposition: A | Discharge status: Home / Self Care | Payer: Medicare Other | Source: Ambulatory Visit | Attending: Family Medicine | Admitting: Family Medicine

## 2019-01-28 ENCOUNTER — Other Ambulatory Visit: Payer: Self-pay

## 2019-01-28 DIAGNOSIS — Z1231 Encounter for screening mammogram for malignant neoplasm of breast: Secondary | ICD-10-CM | POA: Diagnosis not present

## 2019-12-13 ENCOUNTER — Other Ambulatory Visit: Payer: Self-pay

## 2019-12-13 ENCOUNTER — Emergency Department (HOSPITAL_COMMUNITY): Payer: Medicare Other

## 2019-12-13 ENCOUNTER — Encounter (HOSPITAL_COMMUNITY): Payer: Self-pay

## 2019-12-13 ENCOUNTER — Emergency Department (HOSPITAL_COMMUNITY)
Admission: EM | Admit: 2019-12-13 | Discharge: 2019-12-13 | Disposition: A | Discharge status: Home / Self Care | Payer: Medicare Other | Attending: Emergency Medicine | Admitting: Emergency Medicine

## 2019-12-13 DIAGNOSIS — E785 Hyperlipidemia, unspecified: Secondary | ICD-10-CM | POA: Insufficient documentation

## 2019-12-13 DIAGNOSIS — R1032 Left lower quadrant pain: Secondary | ICD-10-CM | POA: Insufficient documentation

## 2019-12-13 DIAGNOSIS — F17210 Nicotine dependence, cigarettes, uncomplicated: Secondary | ICD-10-CM | POA: Diagnosis not present

## 2019-12-13 DIAGNOSIS — Z7984 Long term (current) use of oral hypoglycemic drugs: Secondary | ICD-10-CM | POA: Diagnosis not present

## 2019-12-13 DIAGNOSIS — I10 Essential (primary) hypertension: Secondary | ICD-10-CM | POA: Diagnosis not present

## 2019-12-13 DIAGNOSIS — R112 Nausea with vomiting, unspecified: Secondary | ICD-10-CM | POA: Diagnosis present

## 2019-12-13 DIAGNOSIS — E119 Type 2 diabetes mellitus without complications: Secondary | ICD-10-CM | POA: Diagnosis not present

## 2019-12-13 LAB — URINALYSIS, ROUTINE W REFLEX MICROSCOPIC
Bilirubin Urine: NEGATIVE
Glucose, UA: NEGATIVE mg/dL
Hgb urine dipstick: NEGATIVE
Ketones, ur: NEGATIVE mg/dL
Nitrite: NEGATIVE
Protein, ur: NEGATIVE mg/dL
Specific Gravity, Urine: 1.013 (ref 1.005–1.030)
pH: 5 (ref 5.0–8.0)

## 2019-12-13 LAB — CBC
HCT: 40.4 % (ref 36.0–46.0)
Hemoglobin: 13.1 g/dL (ref 12.0–15.0)
MCH: 32 pg (ref 26.0–34.0)
MCHC: 32.4 g/dL (ref 30.0–36.0)
MCV: 98.5 fL (ref 80.0–100.0)
Platelets: 336 10*3/uL (ref 150–400)
RBC: 4.1 MIL/uL (ref 3.87–5.11)
RDW: 13.8 % (ref 11.5–15.5)
WBC: 8.6 10*3/uL (ref 4.0–10.5)
nRBC: 0 % (ref 0.0–0.2)

## 2019-12-13 LAB — COMPREHENSIVE METABOLIC PANEL
ALT: 17 U/L (ref 0–44)
AST: 29 U/L (ref 15–41)
Albumin: 5.2 g/dL — ABNORMAL HIGH (ref 3.5–5.0)
Alkaline Phosphatase: 74 U/L (ref 38–126)
Anion gap: 15 (ref 5–15)
BUN: 27 mg/dL — ABNORMAL HIGH (ref 8–23)
CO2: 23 mmol/L (ref 22–32)
Calcium: 10.6 mg/dL — ABNORMAL HIGH (ref 8.9–10.3)
Chloride: 97 mmol/L — ABNORMAL LOW (ref 98–111)
Creatinine, Ser: 1.46 mg/dL — ABNORMAL HIGH (ref 0.44–1.00)
GFR calc Af Amer: 41 mL/min — ABNORMAL LOW (ref >60)
GFR calc non Af Amer: 35 mL/min — ABNORMAL LOW (ref >60)
Glucose, Bld: 178 mg/dL — ABNORMAL HIGH (ref 70–99)
Potassium: 3.9 mmol/L (ref 3.5–5.1)
Sodium: 135 mmol/L (ref 135–145)
Total Bilirubin: 0.7 mg/dL (ref 0.3–1.2)
Total Protein: 8.3 g/dL — ABNORMAL HIGH (ref 6.5–8.1)

## 2019-12-13 LAB — LIPASE, BLOOD: Lipase: 16 U/L (ref 11–51)

## 2019-12-13 MED ORDER — IOHEXOL 300 MG/ML  SOLN
75.0000 mL | Freq: Once | INTRAMUSCULAR | Status: AC | PRN
  Administered 2019-12-13: 75 mL via INTRAVENOUS

## 2019-12-13 MED ORDER — IOHEXOL 9 MG/ML PO SOLN
ORAL | Status: AC
  Filled 2019-12-13: qty 500

## 2019-12-13 MED ORDER — SODIUM CHLORIDE 0.9 % IV BOLUS (SEPSIS)
500.0000 mL | Freq: Once | INTRAVENOUS | Status: AC
  Administered 2019-12-13: 500 mL via INTRAVENOUS

## 2019-12-13 MED ORDER — ONDANSETRON HCL 4 MG PO TABS: 4.0000 mg | ORAL_TABLET | Freq: Three times a day (TID) | ORAL | 0 refills | Status: AC | PRN

## 2019-12-13 MED ORDER — FENTANYL CITRATE (PF) 100 MCG/2ML IJ SOLN
100.0000 ug | Freq: Once | INTRAMUSCULAR | Status: AC
Start: 2019-12-13 — End: 2019-12-13
  Administered 2019-12-13: 06:00:00 100 ug via INTRAVENOUS
  Filled 2019-12-13: qty 2

## 2019-12-13 MED ORDER — ONDANSETRON 4 MG PO TBDP: 4.0000 mg | ORAL_TABLET | Freq: Once | ORAL | Status: DC | PRN

## 2019-12-13 MED ORDER — IOHEXOL 9 MG/ML PO SOLN: 500.0000 mL | ORAL | Status: AC

## 2019-12-13 NOTE — ED Triage Notes (Signed)
Patient arrived with lower abdominal pain, N/V since Thursday. Patient reports she thought it was food poisoning but has had no relief.

## 2019-12-13 NOTE — Discharge Instructions (Addendum)
The cause of your abdominal pain was not identified today. You had a CT scan performed in the emergency department that demonstrated some incidental findings such as changes to your pancreas and atherosclerosis. Please follow-up with your family doctor for further evaluation. If you develop fevers, uncontrolled pain or new concerning symptoms please get rechecked immediately.

## 2019-12-13 NOTE — ED Provider Notes (Signed)
Patient care assumed at 0700.  Pt here with abdominal pain, CT pending.    CT scan with chronic changes. Patient with no significant epigastric or right upper quadrant pain. Presentation is not consistent with cholecystitis or acute pancreatitis. Discussed with patient findings of studies. Discussed home care for lower abdominal pain. Discussed PCP follow-up as well as return precautions.   Quintella Reichert, MD 12/13/19 1028
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
